# Patient Record
Sex: Female | Born: 1955 | Race: Black or African American | Hispanic: No | Marital: Single | Smoking: Current every day smoker
Health system: Southern US, Community
[De-identification: ages and names within clinical notes are randomized; demographics above are authoritative.]

## PROBLEM LIST (undated history)

## (undated) DIAGNOSIS — D649 Anemia, unspecified: Secondary | ICD-10-CM

## (undated) DIAGNOSIS — I471 Supraventricular tachycardia, unspecified: Secondary | ICD-10-CM

## (undated) DIAGNOSIS — I509 Heart failure, unspecified: Secondary | ICD-10-CM

## (undated) HISTORY — DX: Heart failure, unspecified: I50.9

## (undated) HISTORY — DX: Supraventricular tachycardia, unspecified: I47.10

## (undated) HISTORY — DX: Anemia, unspecified: D64.9

## (undated) HISTORY — DX: Supraventricular tachycardia: I47.1

---

## 2009-03-08 ENCOUNTER — Emergency Department: Payer: Self-pay | Admitting: Emergency Medicine

## 2011-01-28 ENCOUNTER — Emergency Department (HOSPITAL_COMMUNITY)
Admission: EM | Admit: 2011-01-28 | Discharge: 2011-01-28 | Disposition: A | Discharge status: Home / Self Care | Payer: No Typology Code available for payment source | Attending: Emergency Medicine | Admitting: Emergency Medicine

## 2011-01-28 ENCOUNTER — Emergency Department (HOSPITAL_COMMUNITY): Payer: No Typology Code available for payment source

## 2011-01-28 DIAGNOSIS — T148XXA Other injury of unspecified body region, initial encounter: Secondary | ICD-10-CM | POA: Insufficient documentation

## 2011-01-28 DIAGNOSIS — S335XXA Sprain of ligaments of lumbar spine, initial encounter: Secondary | ICD-10-CM | POA: Insufficient documentation

## 2014-06-17 ENCOUNTER — Emergency Department: Payer: Self-pay | Admitting: Emergency Medicine

## 2014-06-17 LAB — CBC WITH DIFFERENTIAL/PLATELET
Basophil #: 0.1 10*3/uL (ref 0.0–0.1)
Basophil %: 1.1 %
EOS PCT: 0.6 %
Eosinophil #: 0.1 10*3/uL (ref 0.0–0.7)
HCT: 42.7 % (ref 35.0–47.0)
HGB: 13.6 g/dL (ref 12.0–16.0)
LYMPHS PCT: 25.9 %
Lymphocyte #: 2.2 10*3/uL (ref 1.0–3.6)
MCH: 32.7 pg (ref 26.0–34.0)
MCHC: 31.8 g/dL — ABNORMAL LOW (ref 32.0–36.0)
MCV: 103 fL — ABNORMAL HIGH (ref 80–100)
MONO ABS: 0.6 x10 3/mm (ref 0.2–0.9)
MONOS PCT: 6.7 %
NEUTROS PCT: 65.7 %
Neutrophil #: 5.7 10*3/uL (ref 1.4–6.5)
PLATELETS: 356 10*3/uL (ref 150–440)
RBC: 4.16 10*6/uL (ref 3.80–5.20)
RDW: 13.8 % (ref 11.5–14.5)
WBC: 8.6 10*3/uL (ref 3.6–11.0)

## 2014-06-17 LAB — COMPREHENSIVE METABOLIC PANEL
ANION GAP: 12 (ref 7–16)
Albumin: 2.9 g/dL — ABNORMAL LOW (ref 3.4–5.0)
Alkaline Phosphatase: 124 U/L — ABNORMAL HIGH
BILIRUBIN TOTAL: 0.3 mg/dL (ref 0.2–1.0)
BUN: 5 mg/dL — AB (ref 7–18)
CREATININE: 0.54 mg/dL — AB (ref 0.60–1.30)
Calcium, Total: 8 mg/dL — ABNORMAL LOW (ref 8.5–10.1)
Chloride: 108 mmol/L — ABNORMAL HIGH (ref 98–107)
Co2: 21 mmol/L (ref 21–32)
EGFR (African American): >60
GLUCOSE: 94 mg/dL (ref 65–99)
OSMOLALITY: 278 (ref 275–301)
POTASSIUM: 3.6 mmol/L (ref 3.5–5.1)
SGOT(AST): 48 U/L — ABNORMAL HIGH (ref 15–37)
SGPT (ALT): 18 U/L
Sodium: 141 mmol/L (ref 136–145)
TOTAL PROTEIN: 7.2 g/dL (ref 6.4–8.2)

## 2014-06-17 LAB — URINALYSIS, COMPLETE
BACTERIA: NONE SEEN
BILIRUBIN, UR: NEGATIVE
Blood: NEGATIVE
GLUCOSE, UR: NEGATIVE mg/dL (ref 0–75)
KETONE: NEGATIVE
LEUKOCYTE ESTERASE: NEGATIVE
NITRITE: NEGATIVE
PROTEIN: NEGATIVE
Ph: 6 (ref 4.5–8.0)
RBC,UR: <1 /HPF (ref 0–5)
SPECIFIC GRAVITY: 1.006 (ref 1.003–1.030)
Squamous Epithelial: 1
WBC UR: NONE SEEN /HPF (ref 0–5)

## 2014-06-17 LAB — TROPONIN I

## 2018-04-19 ENCOUNTER — Encounter: Payer: Self-pay | Admitting: *Deleted

## 2018-04-19 ENCOUNTER — Emergency Department: Payer: Self-pay

## 2018-04-19 ENCOUNTER — Inpatient Hospital Stay
Admission: EM | Admit: 2018-04-19 | Discharge: 2018-04-21 | DRG: 640 | Disposition: A | Discharge status: Home / Self Care | Payer: Self-pay | Attending: Internal Medicine | Admitting: Internal Medicine

## 2018-04-19 ENCOUNTER — Other Ambulatory Visit: Payer: Self-pay

## 2018-04-19 DIAGNOSIS — E860 Dehydration: Secondary | ICD-10-CM | POA: Diagnosis present

## 2018-04-19 DIAGNOSIS — D638 Anemia in other chronic diseases classified elsewhere: Secondary | ICD-10-CM | POA: Diagnosis present

## 2018-04-19 DIAGNOSIS — E43 Unspecified severe protein-calorie malnutrition: Secondary | ICD-10-CM

## 2018-04-19 DIAGNOSIS — F1020 Alcohol dependence, uncomplicated: Secondary | ICD-10-CM | POA: Diagnosis present

## 2018-04-19 DIAGNOSIS — F17210 Nicotine dependence, cigarettes, uncomplicated: Secondary | ICD-10-CM | POA: Diagnosis present

## 2018-04-19 DIAGNOSIS — E876 Hypokalemia: Secondary | ICD-10-CM | POA: Diagnosis present

## 2018-04-19 DIAGNOSIS — I471 Supraventricular tachycardia: Secondary | ICD-10-CM | POA: Diagnosis present

## 2018-04-19 DIAGNOSIS — E872 Acidosis: Secondary | ICD-10-CM | POA: Diagnosis present

## 2018-04-19 DIAGNOSIS — E871 Hypo-osmolality and hyponatremia: Principal | ICD-10-CM | POA: Diagnosis present

## 2018-04-19 DIAGNOSIS — Z681 Body mass index (BMI) 19 or less, adult: Secondary | ICD-10-CM

## 2018-04-19 LAB — OSMOLALITY: Osmolality: 249 mOsm/kg — CL (ref 275–295)

## 2018-04-19 LAB — PROTIME-INR
INR: 0.87
Prothrombin Time: 11.7 seconds (ref 11.4–15.2)

## 2018-04-19 LAB — URINALYSIS, COMPLETE (UACMP) WITH MICROSCOPIC
BILIRUBIN URINE: NEGATIVE
Glucose, UA: NEGATIVE mg/dL
HGB URINE DIPSTICK: NEGATIVE
Ketones, ur: 5 mg/dL — AB
Leukocytes, UA: NEGATIVE
NITRITE: NEGATIVE
PROTEIN: NEGATIVE mg/dL
Specific Gravity, Urine: 1.012 (ref 1.005–1.030)
pH: 5 (ref 5.0–8.0)

## 2018-04-19 LAB — BASIC METABOLIC PANEL
ANION GAP: 15 (ref 5–15)
BUN: 7 mg/dL — ABNORMAL LOW (ref 8–23)
CHLORIDE: 82 mmol/L — AB (ref 98–111)
CO2: 20 mmol/L — AB (ref 22–32)
Calcium: 8.1 mg/dL — ABNORMAL LOW (ref 8.9–10.3)
Creatinine, Ser: 0.6 mg/dL (ref 0.44–1.00)
GFR calc Af Amer: >60 mL/min (ref >60)
GLUCOSE: 138 mg/dL — AB (ref 70–99)
POTASSIUM: 2.4 mmol/L — AB (ref 3.5–5.1)
Sodium: 117 mmol/L — CL (ref 135–145)

## 2018-04-19 LAB — HEPATIC FUNCTION PANEL
ALT: 17 U/L (ref 0–44)
AST: 41 U/L (ref 15–41)
Albumin: 2.5 g/dL — ABNORMAL LOW (ref 3.5–5.0)
Alkaline Phosphatase: 56 U/L (ref 38–126)
BILIRUBIN DIRECT: 0.3 mg/dL — AB (ref 0.0–0.2)
BILIRUBIN INDIRECT: 0.6 mg/dL (ref 0.3–0.9)
Total Bilirubin: 0.9 mg/dL (ref 0.3–1.2)
Total Protein: 5.4 g/dL — ABNORMAL LOW (ref 6.5–8.1)

## 2018-04-19 LAB — CBC
HEMATOCRIT: 34.4 % — AB (ref 35.0–47.0)
HEMOGLOBIN: 12 g/dL (ref 12.0–16.0)
MCH: 34.5 pg — ABNORMAL HIGH (ref 26.0–34.0)
MCHC: 34.9 g/dL (ref 32.0–36.0)
MCV: 98.8 fL (ref 80.0–100.0)
Platelets: 206 10*3/uL (ref 150–440)
RBC: 3.48 MIL/uL — ABNORMAL LOW (ref 3.80–5.20)
RDW: 13.9 % (ref 11.5–14.5)
WBC: 12.9 10*3/uL — ABNORMAL HIGH (ref 3.6–11.0)

## 2018-04-19 LAB — MAGNESIUM
MAGNESIUM: 1.7 mg/dL (ref 1.7–2.4)
Magnesium: 1.8 mg/dL (ref 1.7–2.4)

## 2018-04-19 LAB — SODIUM, URINE, RANDOM: Sodium, Ur: <10 mmol/L

## 2018-04-19 LAB — OSMOLALITY, URINE: OSMOLALITY UR: 342 mosm/kg (ref 300–900)

## 2018-04-19 LAB — TROPONIN I

## 2018-04-19 MED ORDER — LORAZEPAM 2 MG/ML IJ SOLN: 1.0000 mg | Freq: Four times a day (QID) | INTRAMUSCULAR | Status: DC | PRN

## 2018-04-19 MED ORDER — POTASSIUM CHLORIDE CRYS ER 20 MEQ PO TBCR
40.0000 meq | EXTENDED_RELEASE_TABLET | Freq: Once | ORAL | Status: AC
  Administered 2018-04-19: 40 meq via ORAL
  Filled 2018-04-19: qty 2

## 2018-04-19 MED ORDER — SODIUM CHLORIDE 0.9 % IV SOLN
Freq: Once | INTRAVENOUS | Status: AC
  Administered 2018-04-19: 16:00:00 via INTRAVENOUS

## 2018-04-19 MED ORDER — ONDANSETRON HCL 4 MG/2ML IJ SOLN: 4.0000 mg | Freq: Four times a day (QID) | INTRAMUSCULAR | Status: DC | PRN

## 2018-04-19 MED ORDER — ONDANSETRON HCL 4 MG PO TABS: 4.0000 mg | ORAL_TABLET | Freq: Four times a day (QID) | ORAL | Status: DC | PRN

## 2018-04-19 MED ORDER — LORAZEPAM 1 MG PO TABS: 1.0000 mg | ORAL_TABLET | Freq: Four times a day (QID) | ORAL | Status: DC | PRN

## 2018-04-19 MED ORDER — THIAMINE HCL 100 MG/ML IJ SOLN
Freq: Once | INTRAVENOUS | Status: AC
  Administered 2018-04-19: 17:00:00 via INTRAVENOUS
  Filled 2018-04-19: qty 1000

## 2018-04-19 MED ORDER — ACETAMINOPHEN 325 MG PO TABS: 650.0000 mg | ORAL_TABLET | Freq: Four times a day (QID) | ORAL | Status: DC | PRN

## 2018-04-19 MED ORDER — POTASSIUM CHLORIDE 10 MEQ/100ML IV SOLN
10.0000 meq | INTRAVENOUS | Status: AC
Start: 2018-04-19 — End: 2018-04-19
  Administered 2018-04-19: 10 meq via INTRAVENOUS
  Filled 2018-04-19 (×2): qty 100

## 2018-04-19 MED ORDER — FOLIC ACID 1 MG PO TABS
1.0000 mg | ORAL_TABLET | Freq: Every day | ORAL | Status: DC
  Administered 2018-04-20 – 2018-04-21 (×2): 1 mg via ORAL
  Filled 2018-04-19 (×2): qty 1

## 2018-04-19 MED ORDER — THIAMINE HCL 100 MG/ML IJ SOLN
100.0000 mg | Freq: Every day | INTRAMUSCULAR | Status: DC
  Filled 2018-04-19: qty 1

## 2018-04-19 MED ORDER — ADULT MULTIVITAMIN W/MINERALS CH
1.0000 | ORAL_TABLET | Freq: Every day | ORAL | Status: DC
  Administered 2018-04-20 – 2018-04-21 (×2): 1 via ORAL
  Filled 2018-04-19 (×2): qty 1

## 2018-04-19 MED ORDER — ENOXAPARIN SODIUM 40 MG/0.4ML ~~LOC~~ SOLN
40.0000 mg | SUBCUTANEOUS | Status: DC
  Filled 2018-04-19 (×2): qty 0.4

## 2018-04-19 MED ORDER — VITAMIN B-1 100 MG PO TABS
100.0000 mg | ORAL_TABLET | Freq: Every day | ORAL | Status: DC
  Administered 2018-04-20 – 2018-04-21 (×2): 100 mg via ORAL
  Filled 2018-04-19 (×2): qty 1

## 2018-04-19 MED ORDER — ACETAMINOPHEN 650 MG RE SUPP: 650.0000 mg | Freq: Four times a day (QID) | RECTAL | Status: DC | PRN

## 2018-04-19 MED ORDER — LORAZEPAM 2 MG/ML IJ SOLN
1.0000 mg | Freq: Once | INTRAMUSCULAR | Status: AC
Start: 2018-04-19 — End: 2018-04-19
  Administered 2018-04-19: 1 mg via INTRAVENOUS
  Filled 2018-04-19: qty 1

## 2018-04-19 MED ORDER — ADENOSINE 6 MG/2ML IV SOLN
6.0000 mg | Freq: Once | INTRAVENOUS | Status: DC
  Filled 2018-04-19: qty 2

## 2018-04-19 MED ORDER — POTASSIUM CHLORIDE IN NACL 20-0.9 MEQ/L-% IV SOLN
INTRAVENOUS | Status: DC
  Administered 2018-04-20 (×3): via INTRAVENOUS
  Filled 2018-04-19 (×5): qty 1000

## 2018-04-19 NOTE — ED Triage Notes (Addendum)
P to ED with family that is reporting increased weakness and confusion for the past three weeks that has been getting increasingly worse. Pt has been drooling more per family with a decreased appetite. Family reports pt has been unable to remember conversations and in triage pt stated the wrong date for multiple events in her hx.   Pt is also a daily drinker. "a couple beers a day"

## 2018-04-19 NOTE — ED Provider Notes (Signed)
[facility] Emergency Department Provider Note       Time seen: ----------------------------------------- 3:48 PM on 04/19/2018 ----------------------------------------- Level V caveat: History/ROS limited by altered mental status  I have reviewed the triage vital signs and the nursing notes.  HISTORY   Chief Complaint Weakness and Altered Mental Status   HPI Peggy Brown is a 62 y.o. female with a history of chronic alcohol abuse who presents to the ED for increased weakness and confusion for the past 3 weeks.  This is been increasingly getting worse with poor appetite and poor p.o. intake other than alcohol.  Family reports she has been and unable to remember conversations and gave the wrong date for multiple events in her history.  She only reports drinking a couple beers a day but the family states it is much more than this.  She arrives with some altered mental status  History reviewed. No pertinent past medical history.  There are no active problems to display for this patient.   Past Surgical History:  Procedure Laterality Date  . CESAREAN SECTION      Allergies Patient has no allergy information on record.  Social History Social History   Tobacco Use  . Smoking status: Current Every Day Smoker    Packs/day: 0.50    Types: Cigarettes  . Smokeless tobacco: Never Used  Substance Use Topics  . Alcohol use: Yes    Alcohol/week: 1.2 oz    Types: 2 Cans of beer per week  . Drug use: Never   Review of Systems Constitutional: Negative for fever. Cardiovascular: Negative for chest pain. Respiratory: Negative for shortness of breath. Gastrointestinal: Negative for abdominal pain, vomiting and diarrhea. Musculoskeletal: Negative for back pain. Skin: Negative for rash. Neurological: Positive for weakness, confusion  All systems negative/normal/unremarkable except as stated in the  HPI  ____________________________________________   PHYSICAL EXAM:  VITAL SIGNS: ED Triage Vitals  Enc Vitals Group     BP 04/19/18 1505 (!) 144/81     Pulse Rate 04/19/18 1505 (!) 121     Resp 04/19/18 1505 (!) 22     Temp 04/19/18 1505 98.2 F (36.8 C)     Temp Source 04/19/18 1505 Oral     SpO2 04/19/18 1505 100 %     Weight 04/19/18 1506 110 lb (49.9 kg)     Height 04/19/18 1506 5\' 1"  (1.549 m)     Head Circumference --      Peak Flow --      Pain Score 04/19/18 1506 0     Pain Loc --      Pain Edu? --      Excl. in GC? --    Constitutional: Alert and oriented.  Cachectic Eyes: Conjunctivae are normal. Normal extraocular movements. ENT   Head: Normocephalic and atraumatic.   Nose: No congestion/rhinnorhea.   Mouth/Throat: Mucous membranes are moist.   Neck: No stridor. Cardiovascular: Rapid rate, regular rhythm. No murmurs, rubs, or gallops. Respiratory: Normal respiratory effort without tachypnea nor retractions. Breath sounds are clear and equal bilaterally. No wheezes/rales/rhonchi. Gastrointestinal: Soft and nontender. Normal bowel sounds Musculoskeletal: Nontender with normal range of motion in extremities. No lower extremity tenderness nor edema. Neurologic:  Normal speech and language. No gross focal neurologic deficits are appreciated.  Skin:  Skin is warm, dry and intact. No rash noted. Psychiatric: Mood and affect are normal. Speech and behavior are normal.  ____________________________________________  EKG: Interpreted by me.  Sinus tachycardia with a rate of 111 bpm, normal  PR interval, normal QRS width, ST segment abnormalities concerning for ischemia  Repeat EKG interpreted by me reveals SVT with a rate of 195 bpm, repolarization abnormality likely rate related, normal QT, normal axis  ____________________________________________  ED COURSE:  As part of my medical decision making, I reviewed the following data within the electronic  MEDICAL RECORD NUMBER History obtained from family if available, nursing notes, old chart and ekg, as well as notes from prior ED visits. Patient presented for weakness, chronic alcohol abuse and confusion, we will assess with labs and imaging as indicated at this time. Clinical Course as of Apr 19 1642  Thu Apr 19, 2018  1607 Shunt spontaneously converted out of SVT   [JW]    Clinical Course User Index [JW] Emily FilbertWilliams, Tameem Pullara E, MD   Procedures ____________________________________________   LABS (pertinent positives/negatives)  Labs Reviewed  BASIC METABOLIC PANEL - Abnormal; Notable for the following components:      Result Value   Sodium 117 (*)    Potassium 2.4 (*)    Chloride 82 (*)    CO2 20 (*)    Glucose, Bld 138 (*)    BUN 7 (*)    Calcium 8.1 (*)    All other components within normal limits  CBC - Abnormal; Notable for the following components:   WBC 12.9 (*)    RBC 3.48 (*)    HCT 34.4 (*)    MCH 34.5 (*)    All other components within normal limits  URINALYSIS, COMPLETE (UACMP) WITH MICROSCOPIC - Abnormal; Notable for the following components:   Color, Urine YELLOW (*)    APPearance CLEAR (*)    Ketones, ur 5 (*)    Bacteria, UA RARE (*)    All other components within normal limits  TROPONIN I  HEPATIC FUNCTION PANEL  PROTIME-INR  MAGNESIUM   CRITICAL CARE Performed by: Ulice DashJohnathan E Arian Murley   Total critical care time: 30 minutes  Critical care time was exclusive of separately billable procedures and treating other patients.  Critical care was necessary to treat or prevent imminent or life-threatening deterioration.  Critical care was time spent personally by me on the following activities: development of treatment plan with patient and/or surrogate as well as nursing, discussions with consultants, evaluation of patient's response to treatment, examination of patient, obtaining history from patient or surrogate, ordering and performing treatments and  interventions, ordering and review of laboratory studies, ordering and review of radiographic studies, pulse oximetry and re-evaluation of patient's condition.  RADIOLOGY Images were viewed by me  Chest x-ray  IMPRESSION: No edema or consolidation. ____________________________________________  DIFFERENTIAL DIAGNOSIS   Dehydration, electrolyte abnormality, chronic alcohol abuse, vitamin deficiency, SVT  FINAL ASSESSMENT AND PLAN  Alcohol use disorder, hyponatremia, hypokalemia   Plan: The patient had presented for weakness and confusion. Patient's labs did indicate significant abnormalities including a sodium of 117, potassium of 2.4 and chloride of 82.. Patient's imaging did not reveal any acute process.  She was started on IV fluids and we began repleting her electrolytes with oral and IV medications.  She was also given Ativan to prevent alcohol withdrawal.  I have ordered a banana bag as well.  She was intermittently in and out of SVT but appears to have stabilized at this time without using adenosine.  I will discuss with the hospitalist for admission.   Ulice DashJohnathan E Joshiah Traynham, MD   Note: This note was generated in part or whole with voice recognition software. Voice recognition is usually quite  accurate but there are transcription errors that can and very often do occur. I apologize for any typographical errors that were not detected and corrected.     Emily Filbert, MD 04/19/18 [phone]

## 2018-04-19 NOTE — H&P (Addendum)
[facility] [place] [facility]   PATIENT NAME: Peggy Brown Susi    MR#:  [id number]  DATE OF BIRTH:  [birth year]  DATE OF ADMISSION:  04/19/2018  PRIMARY CARE PHYSICIAN: No primary care provider on file.   REQUESTING/REFERRING PHYSICIAN: Dr. Daryel NovemberJonathan Williams  CHIEF COMPLAINT:   Chief Complaint  Patient presents with  . Weakness  . Altered Mental Status    HISTORY OF PRESENT ILLNESS:  Peggy Brown [place]  is a 62 y.o. female with a known history of alcohol abuse, tobacco abuse who presents to the hospital due to altered mental status and weakness.  Patient herself is a very poor historian therefore most of the history obtained from the family at bedside and also the chart and ER physician.  As per the family patient has been more more lethargic and weak and has been repeating herself over the past few days.  She has not eaten much in the past week or so.  Today she was more more lethargic and seemed more confused and therefore the family was concerned and brought her to the ER for further evaluation.  Patient does drink about 2 bottles of  40 ounces beers daily.  Patient presented to the ER and was noted to be severely hyponatremic with a sodium of 117 and hypokalemic with potassium of 2.4.  Hospitalist services were contacted for admission.  PAST MEDICAL HISTORY:  History reviewed. No pertinent past medical history.  PAST SURGICAL HISTORY:   Past Surgical History:  Procedure Laterality Date  . CESAREAN SECTION      SOCIAL HISTORY:   Social History   Tobacco Use  . Smoking status: Current Every Day Smoker    Packs/day: 0.50    Years: 45.00    Pack years: 22.50    Types: Cigarettes  . Smokeless tobacco: Never Used  Substance Use Topics  . Alcohol use: Yes    Alcohol/week: 1.2 oz    Types: 2 Cans of beer per week    FAMILY HISTORY:   Family History  Problem Relation Age of Onset  . Hypertension Mother   . Thyroid cancer Mother   . COPD Father      DRUG ALLERGIES:  Not on File  REVIEW OF SYSTEMS:   Review of Systems  Unable to perform ROS: Mental acuity    MEDICATIONS AT HOME:   Prior to Admission medications   Not on File      VITAL SIGNS:  Blood pressure 128/80, pulse 91, temperature 98.2 F (36.8 C), temperature source Oral, resp. rate 19, height 5\' 1"  (1.549 m), weight 49.9 kg (110 lb), SpO2 100 %.  PHYSICAL EXAMINATION:  Physical Exam  GENERAL:  62 y.o.-year-old patient lying in the bed lethargic/encephalopathic. EYES: Pupils equal, round, reactive to light and accommodation. No scleral icterus. Extraocular muscles intact.  HEENT: Head atraumatic, normocephalic. Oropharynx and nasopharynx clear. No oropharyngeal erythema, dry oral mucosa. NECK:  Supple, no jugular venous distention. No thyroid enlargement, no tenderness.  LUNGS: Normal breath sounds bilaterally, no wheezing, rales, + upper airway rhonchi. No use of accessory muscles of respiration.  CARDIOVASCULAR: S1, S2 RRR. No murmurs, rubs, gallops, clicks.  ABDOMEN: Soft, nontender, nondistended. Bowel sounds present. No organomegaly or mass.  EXTREMITIES: No pedal edema, cyanosis, or clubbing. + 2 pedal & radial pulses b/l.   NEUROLOGIC: Cranial nerves II through XII are intact. No focal Motor or sensory deficits appreciated b/l PSYCHIATRIC: The patient is alert and oriented x 3. SKIN: No obvious rash, lesion, or  ulcer.   LABORATORY PANEL:   CBC Recent Labs  Lab 04/19/18 1513  WBC 12.9*  HGB 12.0  HCT 34.4*  PLT 206   ------------------------------------------------------------------------------------------------------------------  Chemistries  Recent Labs  Lab 04/19/18 1513  NA 117*  K 2.4*  CL 82*  CO2 20*  GLUCOSE 138*  BUN 7*  CREATININE 0.60  CALCIUM 8.1*   ------------------------------------------------------------------------------------------------------------------  Cardiac Enzymes No results for input(s): TROPONINI in  the last 168 hours. ------------------------------------------------------------------------------------------------------------------  RADIOLOGY:  Dg Chest 1 View  Result Date: 04/19/2018 CLINICAL DATA:  Confusion and weakness EXAM: CHEST  1 VIEW COMPARISON:  June 17, 2014 FINDINGS: There is no evident edema or consolidation. Heart size and pulmonary vascularity are normal. No adenopathy. No bone lesions. IMPRESSION: No edema or consolidation. Electronically Signed   By: Bretta Bang III M.D.   On: 04/19/2018 16:35     IMPRESSION AND PLAN:   62 yo female w/ hx of ETOH abuse, tobacco abuse, presents to the hospital due to AMS, weakness and noted to be severely Hyponatremic and hypokalemic.    1.  Acute hyponatremia-secondary to dehydration and be reported mania. - We will hydrate the patient with IV fluids, follow sodium serially. - We will get a nephrology consult, discussed with the nephrologist will see the patient tomorrow.  I will check a urine and serum osmolality and a random urine sodium.  2.  Hypokalemia-also secondary to alcohol abuse/dehydration. -We will give the patient IV and oral potassium supplements.  Repeat level in the morning. -Check magnesium level and replace accordingly.  3.  Alcohol abuse- patient is a high risk for going to alcohol withdrawal. - We will place the patient on CIWA protocol.  4.  SVT-patient developed some SVT in the ER.  She responded well to adenosine and now is in a normal sinus rhythm.  This was likely secondary to the patient's electrolyte abnormalities.  We will keep the patient on telemetry for now.  All the records are reviewed and case discussed with ED provider. Management plans discussed with the patient, family and they are in agreement.  CODE STATUS: Full code  TOTAL TIME TAKING CARE OF THIS PATIENT: 45 minutes.    Houston Siren M.D on 04/19/2018 at 5:31 PM  Between 7am to 6pm - Pager - [phone]  After 6pm go to  [email or web address] - password [email or web address] [facility]  [place] [facility]  Office  [phone]  CC: Primary care physician; No primary care provider on file.

## 2018-04-20 LAB — BASIC METABOLIC PANEL
Anion gap: 3 — ABNORMAL LOW (ref 5–15)
BUN: 6 mg/dL — AB (ref 8–23)
CHLORIDE: 103 mmol/L (ref 98–111)
CO2: 24 mmol/L (ref 22–32)
Calcium: 7.5 mg/dL — ABNORMAL LOW (ref 8.9–10.3)
Creatinine, Ser: 0.4 mg/dL — ABNORMAL LOW (ref 0.44–1.00)
GFR calc Af Amer: >60 mL/min (ref >60)
GFR calc non Af Amer: >60 mL/min (ref >60)
GLUCOSE: 92 mg/dL (ref 70–99)
POTASSIUM: 4 mmol/L (ref 3.5–5.1)
Sodium: 130 mmol/L — ABNORMAL LOW (ref 135–145)

## 2018-04-20 LAB — IRON AND TIBC
Iron: 66 ug/dL (ref 28–170)
SATURATION RATIOS: 39 % — AB (ref 10.4–31.8)
TIBC: 170 ug/dL — ABNORMAL LOW (ref 250–450)
UIBC: 104 ug/dL

## 2018-04-20 LAB — CBC
HEMATOCRIT: 27.9 % — AB (ref 35.0–47.0)
Hemoglobin: 9.7 g/dL — ABNORMAL LOW (ref 12.0–16.0)
MCH: 34.6 pg — AB (ref 26.0–34.0)
MCHC: 34.8 g/dL (ref 32.0–36.0)
MCV: 99.3 fL (ref 80.0–100.0)
Platelets: 154 10*3/uL (ref 150–440)
RBC: 2.81 MIL/uL — ABNORMAL LOW (ref 3.80–5.20)
RDW: 13.4 % (ref 11.5–14.5)
WBC: 9.5 10*3/uL (ref 3.6–11.0)

## 2018-04-20 LAB — RETICULOCYTES
RBC.: 2.84 MIL/uL — AB (ref 3.80–5.20)
RETIC CT PCT: 1.3 % (ref 0.4–3.1)
Retic Count, Absolute: 36.9 10*3/uL (ref 19.0–183.0)

## 2018-04-20 LAB — FOLATE: Folate: >100 ng/mL (ref >5.9)

## 2018-04-20 LAB — PHOSPHORUS: Phosphorus: 1.8 mg/dL — ABNORMAL LOW (ref 2.5–4.6)

## 2018-04-20 LAB — FERRITIN: Ferritin: 286 ng/mL (ref 11–307)

## 2018-04-20 LAB — VITAMIN B12: VITAMIN B 12: 1129 pg/mL — AB (ref 180–914)

## 2018-04-20 MED ORDER — ENSURE ENLIVE PO LIQD
237.0000 mL | Freq: Two times a day (BID) | ORAL | Status: DC
  Administered 2018-04-20 – 2018-04-21 (×3): 237 mL via ORAL

## 2018-04-20 MED ORDER — MAGNESIUM OXIDE 400 (241.3 MG) MG PO TABS
400.0000 mg | ORAL_TABLET | Freq: Two times a day (BID) | ORAL | Status: DC
  Administered 2018-04-20 – 2018-04-21 (×3): 400 mg via ORAL
  Filled 2018-04-20 (×3): qty 1

## 2018-04-20 NOTE — Progress Notes (Signed)
Admitted from ED with severe electrolyte imbalance and ETOH withdrawal.  Last drink was today according to daughter.  Patient is easily arousable but drowsy - received Ativan in ED.  BP and HR WNL.   Rhythm normal sinus.

## 2018-04-20 NOTE — Plan of Care (Signed)
°  Problem: Coping: °Goal: Level of anxiety will decrease °Outcome: Progressing °  °

## 2018-04-20 NOTE — Progress Notes (Signed)
[facility] [place] at [facility]   PATIENT NAME: Peggy SparrowCeleste Brown    MR#:  [id number]  DATE OF BIRTH:  03/21/1956  SUBJECTIVE:  Doing well today. Patient states that she feels fine. No concerns. Denies any chest pain or palpitations.  REVIEW OF SYSTEMS:  Review of Systems  Constitutional: Negative for chills and fever.  Respiratory: Positive for cough. Negative for sputum production and shortness of breath.   Cardiovascular: Negative for chest pain, palpitations and leg swelling.  Gastrointestinal: Negative for diarrhea, nausea and vomiting.  Musculoskeletal: Negative for joint pain and myalgias.  Neurological: Negative for dizziness and headaches.    DRUG ALLERGIES:  Not on File VITALS:  Blood pressure 102/69, pulse 96, temperature 97.9 F (36.6 C), temperature source Oral, resp. rate 16, height 5\' 1"  (1.549 m), weight 41.2 kg (90 lb 14.4 oz), SpO2 100 %. PHYSICAL EXAMINATION:  Physical Exam  GENERAL:  62 y.o.-year-old patient, sitting up in bed, pleasant and conversational. EYES: PERRLA. No scleral icterus. Extraocular muscles intact.  HEENT: Head atraumatic, normocephalic. Moist mucous membranes. NECK:  Supple, no JVD. No thyroid enlargement, no tenderness.  LUNGS: Normal breath sounds bilaterally, no wheezing, rales, or rhonchi. No use of accessory muscles of respiration.  CARDIOVASCULAR: S1, S2, RRR. No murmurs, rubs, gallops, clicks.  ABDOMEN: Soft, nontender, nondistended. Bowel sounds present. No organomegaly or mass.  EXTREMITIES: No pedal edema, cyanosis, or clubbing.   NEUROLOGIC: Cranial nerves II through XII are intact. No focal motor or sensory deficits appreciated b/l PSYCHIATRIC: The patient is alert and oriented x 3. Appropriate affect. SKIN: No obvious rash, lesion, or ulcers on exposed skin.  LABORATORY PANEL:  Female CBC Recent Labs  Lab 04/20/18 0436  WBC 9.5  HGB 9.7*  HCT 27.9*  PLT 154    ------------------------------------------------------------------------------------------------------------------ Chemistries  Recent Labs  Lab 04/19/18 1632 04/19/18 1950 04/20/18 0436  NA  --   --  130*  K  --   --  4.0  CL  --   --  103  CO2  --   --  24  GLUCOSE  --   --  92  BUN  --   --  6*  CREATININE  --   --  0.40*  CALCIUM  --   --  7.5*  MG 1.7 1.8  --   AST 41  --   --   ALT 17  --   --   ALKPHOS 56  --   --   BILITOT 0.9  --   --    RADIOLOGY:  Dg Chest 1 View  Result Date: 04/19/2018 CLINICAL DATA:  Confusion and weakness EXAM: CHEST  1 VIEW COMPARISON:  June 17, 2014 FINDINGS: There is no evident edema or consolidation. Heart size and pulmonary vascularity are normal. No adenopathy. No bone lesions. IMPRESSION: No edema or consolidation. Electronically Signed   By: Bretta BangWilliam  Woodruff III M.D.   On: 04/19/2018 16:35   ASSESSMENT AND PLAN:   62 yo female w/ hx of ETOH abuse, tobacco abuse, presents to the hospital due to AMS, weakness and noted to be severely Hyponatremic and hypokalemic.    1.  Acute hyponatremia- secondary to dehydration and beer potomania. Improving. Na 117 on admission > 130 today.  - nephrology following - IVFs cut in half for today, encourage po intake - can d/c IVFs tomorrow  2.  Hypokalemia-also secondary to alcohol abuse/dehydration. K improved from 2.4 on admission to 4.0 today. - receiving K in IVFs -  check bmp in the am  3.  Alcohol abuse- patient is a high risk for going to alcohol withdrawal. - continue CIWA- scores have been 0  4.  SVT-patient developed some SVT in the ER.  She responded well to adenosine and now is in a normal sinus rhythm.  This was likely secondary to the patient's electrolyte abnormalities - continue telemetry  5. Anemia- borderline macrocytic, likely due to alcohol use. No active bleeding. - check vitamin B12, folate, iron, TIBC, ferritin, reticulocytes - needs outpatient colonoscopy  Care  management consult to help patient establish care with a PCP.  All the records are reviewed and case discussed with Care Management/Social Worker. Management plans discussed with the patient, family and they are in agreement.  CODE STATUS: Full Code  TOTAL TIME TAKING CARE OF THIS PATIENT: 30 minutes.   More than 50% of the time was spent in counseling/coordination of care: YES  POSSIBLE D/C LIKELY TOMORROW, DEPENDING ON CLINICAL CONDITION.   Jinny Blossom Mayo M.D on 04/20/2018 at 1:16 PM  Between 7am to 6pm - Pager - [phone]  After 6pm go to [email or web address] - Social research officer, government  [facility] [place] [facility]  Office  [phone]  CC: Primary care physician; Patient, No Pcp Per  Note: This dictation was prepared with Dragon dictation along with smaller phrase technology. Any transcriptional errors that result from this process are unintentional.

## 2018-04-20 NOTE — Progress Notes (Signed)
She has bent her arm so fully and tangled the tubing with the phone cord and tele wires that the IV site in her Right AC is bleeding.  I've moved the IV to her Left AC and splinted it with gauze.  I have stressed to her that she needs to be cautious with her arm movements, with no success.  I just found her opening the pump door to stop the pump from beeping.  Fortunately the tubing is clamped off when the door is opened.  I've locked the pump settings.

## 2018-04-20 NOTE — Progress Notes (Signed)
Initial Nutrition Assessment  DOCUMENTATION CODES:   Severe malnutrition in context of social or environmental circumstances  INTERVENTION:   Ensure Enlive po BID, each supplement provides 350 kcal and 20 grams of protein  Magic cup TID with meals, each supplement provides 290 kcal and 9 grams of protein  MVI, thiamine and folic acid daily in the setting of etoh abuse  Pt likely at high refeeding risk; recommend monitor K, Mg and P labs daily until labs stabilize   NUTRITION DIAGNOSIS:   Severe Malnutrition related to social / environmental circumstances(etoh abuse ) as evidenced by severe fat depletion, severe muscle depletion.  GOAL:   Patient will meet greater than or equal to 90% of their needs  MONITOR:   PO intake, Supplement acceptance, Labs, Weight trends, Skin, I & O's  REASON FOR ASSESSMENT:   Other (Comment)(low BMI )    ASSESSMENT:   62 yo female w/ hx of ETOH abuse, tobacco abuse, presents to the hospital due to AMS, weakness and noted to be severely Hyponatremic and hypokalemic.     Met with pt in room today. Pt reports "I eat when I want to" when asked how her oral intake has been at home. RD suspects pt with poor appetite and oral intake at baseline r/t etoh abuse. Pt reports her weight is stable; there is no weight history in chart to confirm. Pt eating lunch at time of RD visit today; had eaten abut 50% of her meal tray. Pt does not drink supplements at home but reports that she does like Ensure. Pt likely at high refeeding risk; recommend monitor K, Mg and P labs. RD will order supplements. Continue MVI, folic acid and thiamine.    Medications reviewed and include: lovenox, folic acid, thiamine, MVI, Mg oxide, NaCl w/ KCl '@75ml' /hr  Labs reviewed: Na 130(L), BUN 6(L), creat 0.40(L), 7.5(L) Mg 1.8 wnl- 7/18 Hgb 9.7(L), Hct 27.9(L)  NUTRITION - FOCUSED PHYSICAL EXAM:    Most Recent Value  Orbital Region  Moderate depletion  Upper Arm Region  Severe  depletion  Thoracic and Lumbar Region  Severe depletion  Buccal Region  Moderate depletion  Temple Region  Mild depletion  Clavicle Bone Region  Moderate depletion  Clavicle and Acromion Bone Region  Moderate depletion  Scapular Bone Region  Moderate depletion  Dorsal Hand  Severe depletion  Patellar Region  Severe depletion  Anterior Thigh Region  Severe depletion  Posterior Calf Region  Severe depletion  Edema (RD Assessment)  None  Hair  Reviewed  Eyes  Reviewed  Mouth  Reviewed  Skin  Reviewed  Nails  Reviewed     Diet Order:   Diet Order           Diet regular Room service appropriate? Yes; Fluid consistency: Thin  Diet effective now         EDUCATION NEEDS:   Education needs have been addressed  Skin:  Skin Assessment: Reviewed RN Assessment  Last BM:  7/18  Height:   Ht Readings from Last 1 Encounters:  04/20/18 '5\' 1"'  (1.549 m)    Weight:   Wt Readings from Last 1 Encounters:  04/20/18 90 lb 14.4 oz (41.2 kg)    Ideal Body Weight:  47.7 kg  BMI:  Body mass index is 17.18 kg/m.  Estimated Nutritional Needs:   Kcal:  1200-1400kcal/day   Protein:  61-70g/day   Fluid:  >1.0L/day   Koleen Distance MS, RD, LDN Pager #- [phone] Office#- [phone] After Hours Pager: [phone]

## 2018-04-20 NOTE — Consult Note (Signed)
[facility]  CONSULT NOTE    Date: 04/20/2018                  Patient Name:  Peggy Brown  MRN: [id number]  DOB: Jul 09, 1956  Age / Sex: 62 y.o., female         PCP: Patient, No Pcp Per                 Service Requesting Consult: Dr. Nancy Marus                 Reason for Consult: Hyponatremia            History of Present Illness: [name] is a 62 y.o. black female with EtOH abuse, and tobacco abuse, who was admitted to [facility] on 04/19/2018 for Hypokalemia [E87.6] Hyponatremia [E87.1] SVT (supraventricular tachycardia) (HCC) [I47.1] Alcohol use disorder, severe, dependence (HCC) [F10.20]   Patient's daughter is at bedside who assists with history taking. Patient has been drinking heavily with beer for last few months. She is living with a roommate.   Patient does not follow with a primary care physician.    Medications: Outpatient medications: No medications prior to admission.    Current medications: Current Facility-Administered Medications  Medication Dose Route Frequency Provider Last Rate Last Dose  . 0.9 % NaCl with KCl 20 mEq/ L  infusion   Intravenous Continuous Lamont Dowdy, MD 75 mL/hr at 04/20/18 0922    . acetaminophen (TYLENOL) tablet 650 mg  650 mg Oral Q6H PRN Houston Siren, MD       Or  . acetaminophen (TYLENOL) suppository 650 mg  650 mg Rectal Q6H PRN Houston Siren, MD      . enoxaparin (LOVENOX) injection 40 mg  40 mg Subcutaneous Q24H Sainani, Vivek J, MD      . folic acid (FOLVITE) tablet 1 mg  1 mg Oral Daily Houston Siren, MD   1 mg at 04/20/18 1008  . LORazepam (ATIVAN) tablet 1 mg  1 mg Oral Q6H PRN Houston Siren, MD       Or  . LORazepam (ATIVAN) injection 1 mg  1 mg Intravenous Q6H PRN Houston Siren, MD      . multivitamin with minerals tablet 1 tablet  1 tablet Oral Daily Houston Siren, MD   1 tablet at 04/20/18 1007  . ondansetron (ZOFRAN) tablet 4 mg  4 mg Oral Q6H PRN Houston Siren, MD        Or  . ondansetron (ZOFRAN) injection 4 mg  4 mg Intravenous Q6H PRN Houston Siren, MD      . thiamine (VITAMIN B-1) tablet 100 mg  100 mg Oral Daily Houston Siren, MD   100 mg at 04/20/18 1007   Or  . thiamine (B-1) injection 100 mg  100 mg Intravenous Daily Houston Siren, MD          Allergies: Not on File    Past Medical History: History reviewed. No pertinent past medical history.   Past Surgical History: Past Surgical History:  Procedure Laterality Date  . CESAREAN SECTION       Family History: Family History  Problem Relation Age of Onset  . Hypertension Mother   . Thyroid cancer Mother   . COPD Father      Social History: Social History   Socioeconomic History  . Marital status: Married    Spouse name: Not on file  . Number of  children: Not on file  . Years of education: Not on file  . Highest education level: Not on file  Occupational History  . Not on file  Social Needs  . Financial resource strain: Not on file  . Food insecurity:    Worry: Not on file    Inability: Not on file  . Transportation needs:    Medical: Not on file    Non-medical: Not on file  Tobacco Use  . Smoking status: Current Every Day Smoker    Packs/day: 0.50    Years: 45.00    Pack years: 22.50    Types: Cigarettes  . Smokeless tobacco: Never Used  Substance and Sexual Activity  . Alcohol use: Yes    Alcohol/week: 1.2 oz    Types: 2 Cans of beer per week  . Drug use: Never  . Sexual activity: Never  Lifestyle  . Physical activity:    Days per week: Not on file    Minutes per session: Not on file  . Stress: Not on file  Relationships  . Social connections:    Talks on phone: Not on file    Gets together: Not on file    Attends religious service: Not on file    Active member of club or organization: Not on file    Attends meetings of clubs or organizations: Not on file    Relationship status: Not on file  . Intimate partner violence:    Fear of  current or ex partner: Not on file    Emotionally abused: Not on file    Physically abused: Not on file    Forced sexual activity: Not on file  Other Topics Concern  . Not on file  Social History Narrative  . Not on file     Review of Systems: Review of Systems  Constitutional: Positive for malaise/fatigue and weight loss. Negative for chills, diaphoresis and fever.  HENT: Negative.  Negative for congestion, ear discharge, ear pain, hearing loss, nosebleeds, sinus pain, sore throat and tinnitus.   Eyes: Negative.  Negative for blurred vision, double vision, photophobia, pain, discharge and redness.  Respiratory: Negative for cough, hemoptysis, sputum production, shortness of breath, wheezing and stridor.   Cardiovascular: Negative.  Negative for chest pain, palpitations, orthopnea, claudication, leg swelling and PND.  Gastrointestinal: Negative.  Negative for abdominal pain, blood in stool, constipation, diarrhea, heartburn, melena, nausea and vomiting.  Genitourinary: Negative.  Negative for dysuria, flank pain, frequency, hematuria and urgency.  Musculoskeletal: Negative.  Negative for back pain, falls, joint pain, myalgias and neck pain.  Skin: Negative.  Negative for itching and rash.  Neurological: Negative.  Negative for dizziness, tingling, tremors, sensory change, speech change, focal weakness, seizures, loss of consciousness, weakness and headaches.  Endo/Heme/Allergies: Negative.  Negative for environmental allergies and polydipsia. Does not bruise/bleed easily.  Psychiatric/Behavioral: Positive for depression, memory loss and substance abuse. Negative for hallucinations and suicidal ideas. The patient is nervous/anxious. The patient does not have insomnia.     Vital Signs: Blood pressure 102/69, pulse 96, temperature 97.9 F (36.6 C), temperature source Oral, resp. rate 16, height 5\' 1"  (1.549 m), weight 41.2 kg (90 lb 14.4 oz), SpO2 100 %.  Weight trends: Filed Weights    04/19/18 1506 04/20/18 1013  Weight: 49.9 kg (110 lb) 41.2 kg (90 lb 14.4 oz)    Physical Exam: General: NAD, sitting up in bed  Head: Normocephalic, atraumatic. Moist oral mucosal membranes  Eyes: Anicteric, PERRL  Neck: Supple, trachea midline  Lungs:  Clear to auscultation  Heart: Regular rate and rhythm  Abdomen:  Soft, nontender,   Extremities:  no peripheral edema.  Neurologic: Nonfocal, moving all four extremities  Skin: No lesions         Lab results: Basic Metabolic Panel: Recent Labs  Lab 04/19/18 1513 04/19/18 1632 04/19/18 1950 04/20/18 0436  NA 117*  --   --  130*  K 2.4*  --   --  4.0  CL 82*  --   --  103  CO2 20*  --   --  24  GLUCOSE 138*  --   --  92  BUN 7*  --   --  6*  CREATININE 0.60  --   --  0.40*  CALCIUM 8.1*  --   --  7.5*  MG  --  1.7 1.8  --     Liver Function Tests: Recent Labs  Lab 04/19/18 1632  AST 41  ALT 17  ALKPHOS 56  BILITOT 0.9  PROT 5.4*  ALBUMIN 2.5*   No results for input(s): LIPASE, AMYLASE in the last 168 hours. No results for input(s): AMMONIA in the last 168 hours.  CBC: Recent Labs  Lab 04/19/18 1513 04/20/18 0436  WBC 12.9* 9.5  HGB 12.0 9.7*  HCT 34.4* 27.9*  MCV 98.8 99.3  PLT 206 154    Cardiac Enzymes: Recent Labs  Lab 04/19/18 1632  TROPONINI <0.03    BNP: Invalid input(s): POCBNP  CBG: No results for input(s): GLUCAP in the last 168 hours.  Microbiology: No results found for this or any previous visit.  Coagulation Studies: Recent Labs    04/19/18 1632  LABPROT 11.7  INR 0.87    Urinalysis: Recent Labs    04/19/18 1513  COLORURINE YELLOW*  LABSPEC 1.012  PHURINE 5.0  GLUCOSEU NEGATIVE  HGBUR NEGATIVE  BILIRUBINUR NEGATIVE  KETONESUR 5*  PROTEINUR NEGATIVE  NITRITE NEGATIVE  LEUKOCYTESUR NEGATIVE      Imaging: Dg Chest 1 View  Result Date: 04/19/2018 CLINICAL DATA:  Confusion and weakness EXAM: CHEST  1 VIEW COMPARISON:  June 17, 2014 FINDINGS: There is  no evident edema or consolidation. Heart size and pulmonary vascularity are normal. No adenopathy. No bone lesions. IMPRESSION: No edema or consolidation. Electronically Signed   By: Bretta Bang III M.D.   On: 04/19/2018 16:35      Assessment & Plan: [name] is a 62 y.o. black female with EtOH abuse, and tobacco abuse, who was admitted to [facility] on 04/19/2018 for Hypokalemia [E87.6] Hyponatremia [E87.1] SVT (supraventricular tachycardia) (HCC) [I47.1] Alcohol use disorder, severe, dependence (HCC) [F10.20]   1. Hyponatremia 2. Hypokalemia 3. Metabolic acidosis 4. Anemia - normocytic  Plan Hypovolemic hyponatremia secondary beer intoxication - Continue IV NS with potassium  - replace magnesium PO - Encourage PO intake.   LOS: 1 Peggy Brown 7/19/201911:28 AM

## 2018-04-20 NOTE — Progress Notes (Signed)
CRITICAL VALUE ALERT  Critical Value:  Osmolality of 249 mOsm/kg called by lab 04/19/18 at 2347.    Date & Time Notied:  04/20/18 0014  Provider Notified: On-call  Orders Received/Actions taken: awaiting call back

## 2018-04-20 NOTE — Progress Notes (Signed)
CRITICAL VALUE ALERT  Critical Value:  Osmolality of 249 mOsm/kg called by lab 04/19/18 at 2347  Date & Time Notied:  04/20/18 0014  Provider Notified: Dr. Cammy CopaAngela Maier  Orders Received/Actions taken: None; this does not change plan of care and is in line with the patient's diagnosis.

## 2018-04-21 DIAGNOSIS — E43 Unspecified severe protein-calorie malnutrition: Secondary | ICD-10-CM

## 2018-04-21 LAB — CBC
HCT: 23.7 % — ABNORMAL LOW (ref 35.0–47.0)
Hemoglobin: 8.5 g/dL — ABNORMAL LOW (ref 12.0–16.0)
MCH: 35.4 pg — AB (ref 26.0–34.0)
MCHC: 35.7 g/dL (ref 32.0–36.0)
MCV: 99.2 fL (ref 80.0–100.0)
PLATELETS: 169 10*3/uL (ref 150–440)
RBC: 2.39 MIL/uL — ABNORMAL LOW (ref 3.80–5.20)
RDW: 13.6 % (ref 11.5–14.5)
WBC: 9.1 10*3/uL (ref 3.6–11.0)

## 2018-04-21 LAB — BASIC METABOLIC PANEL
ANION GAP: 4 — AB (ref 5–15)
CALCIUM: 7.6 mg/dL — AB (ref 8.9–10.3)
CO2: 22 mmol/L (ref 22–32)
CREATININE: 0.41 mg/dL — AB (ref 0.44–1.00)
Chloride: 105 mmol/L (ref 98–111)
GFR calc non Af Amer: >60 mL/min (ref >60)
GLUCOSE: 86 mg/dL (ref 70–99)
POTASSIUM: 4.1 mmol/L (ref 3.5–5.1)
Sodium: 131 mmol/L — ABNORMAL LOW (ref 135–145)

## 2018-04-21 LAB — HIV ANTIBODY (ROUTINE TESTING W REFLEX): HIV SCREEN 4TH GENERATION: NONREACTIVE

## 2018-04-21 LAB — PHOSPHORUS: PHOSPHORUS: 1.7 mg/dL — AB (ref 2.5–4.6)

## 2018-04-21 MED ORDER — ENSURE ENLIVE PO LIQD: 237.0000 mL | Freq: Two times a day (BID) | ORAL | 0 refills | Status: DC

## 2018-04-21 MED ORDER — THIAMINE HCL 100 MG PO TABS: 100.0000 mg | ORAL_TABLET | Freq: Every day | ORAL | 0 refills | Status: DC

## 2018-04-21 MED ORDER — FOLIC ACID 1 MG PO TABS: 1.0000 mg | ORAL_TABLET | Freq: Every day | ORAL | 0 refills | Status: DC

## 2018-04-21 MED ORDER — METOPROLOL SUCCINATE ER 25 MG PO TB24
12.5000 mg | ORAL_TABLET | Freq: Every day | ORAL | Status: DC
  Administered 2018-04-21: 12.5 mg via ORAL
  Filled 2018-04-21: qty 1

## 2018-04-21 MED ORDER — METOPROLOL SUCCINATE ER 25 MG PO TB24: 12.5000 mg | ORAL_TABLET | Freq: Every day | ORAL | 0 refills | Status: DC

## 2018-04-21 MED ORDER — MAGNESIUM OXIDE 400 (241.3 MG) MG PO TABS: 400.0000 mg | ORAL_TABLET | Freq: Every day | ORAL | 0 refills | Status: DC

## 2018-04-21 MED ORDER — ADULT MULTIVITAMIN W/MINERALS CH: 1.0000 | ORAL_TABLET | Freq: Every day | ORAL | 0 refills | Status: DC

## 2018-04-21 MED ORDER — SODIUM PHOSPHATES 45 MMOLE/15ML IV SOLN
15.0000 mmol | Freq: Once | INTRAVENOUS | Status: AC
  Administered 2018-04-21: 15 mmol via INTRAVENOUS
  Filled 2018-04-21: qty 5

## 2018-04-21 NOTE — Progress Notes (Signed)
Patient is to be discharge, Alcario DroughtErica, RN went over discharge instructions with the patient and family. Discontinue peripheral IV and telemetry monitor. Sodium Phosphate drip is done. RN will help patient for transport.

## 2018-04-21 NOTE — Progress Notes (Signed)
Patient to be d/c per MD order after IV sodium Phosphate finish. Discharge instructions and prescriptions given to patient.

## 2018-04-21 NOTE — Care Management (Signed)
Patient to be discharged today per MD order. Patient without insurance, given resources including medication management application and [facility] information. Patient plans to follow up on Monday. Discharging medications are vitamins and [facility]. RNCM to sign off. Buddy DutyJosh Mekia Dipinto RN BSN RNCM [phone]

## 2018-04-21 NOTE — Discharge Summary (Signed)
[employer] - [place] [facility]   PATIENT NAME: Peggy Brown    MR#:  [id number]  DATE OF BIRTH:  02-18-1956  DATE OF ADMISSION:  04/19/2018 ADMITTING PHYSICIAN: Houston Siren, MD  DATE OF DISCHARGE: 04/21/2018  PRIMARY CARE PHYSICIAN: Patient, No Pcp Per    ADMISSION DIAGNOSIS:  Hypokalemia [E87.6] Hyponatremia [E87.1] SVT (supraventricular tachycardia) (HCC) [I47.1] Alcohol use disorder, severe, dependence (HCC) [F10.20]  DISCHARGE DIAGNOSIS:  Active Problems:   Hyponatremia   Protein-calorie malnutrition, severe   SECONDARY DIAGNOSIS:  History reviewed. No pertinent past medical history.  HOSPITAL COURSE:   1.  Hyponatremia secondary to dehydration and beer potomania.  Sodium has improved to 131 with hydration. 2.  Hypokalemia and hypomagnesemia and hypophosphatemia.  K-Phos given today.  Magnesium given orally.  Potassium was given an IV fluids.  Potassium now in the normal range.  Patient would like to go home after K-Phos IV today. 3.  Alcohol abuse.  I explained that this is the cause of the all the patient's issues with her electrolyte abnormalities.  I spent quite a bit of time with the patient explaining this.  I stated if she goes back to alcohol she will end up back in the hospital with electrolyte abnormalities. 4.  SVT in the emergency room requiring adenosine.  Patient now in normal sinus rhythm.  Likely secondary to electrolyte abnormalities.  I will give a low-dose Toprol 12.5 mg daily. 5.  Anemia of chronic disease with alcohol.  Could be bone marrow suppression with alcohol.  DISCHARGE CONDITIONS:   Fair  CONSULTS OBTAINED:  Treatment Team:  Lamont Dowdy, MD  DRUG ALLERGIES:  Not on File  DISCHARGE MEDICATIONS:   Allergies as of 04/21/2018   Not on File     Medication List    TAKE these medications   feeding supplement (ENSURE ENLIVE) Liqd Take 237 mLs by mouth 2 (two) times daily between meals.   folic acid 1 MG  tablet Commonly known as:  FOLVITE Take 1 tablet (1 mg total) by mouth daily. Start taking on:  04/22/2018   magnesium oxide 400 (241.3 Mg) MG tablet Commonly known as:  MAG-OX Take 1 tablet (400 mg total) by mouth daily.   metoprolol succinate 25 MG 24 hr tablet Commonly known as:  TOPROL-XL Take 0.5 tablets (12.5 mg total) by mouth daily.   multivitamin with minerals Tabs tablet Take 1 tablet by mouth daily. Start taking on:  04/22/2018   thiamine 100 MG tablet Take 1 tablet (100 mg total) by mouth daily. Start taking on:  04/22/2018        DISCHARGE INSTRUCTIONS:   Follow-up with Dr. on [facility] card 6 days  If you experience worsening of your admission symptoms, develop shortness of breath, life threatening emergency, suicidal or homicidal thoughts you must seek medical attention immediately by calling 911 or calling your MD immediately  if symptoms less severe.  You Must read complete instructions/literature along with all the possible adverse reactions/side effects for all the Medicines you take and that have been prescribed to you. Take any new Medicines after you have completely understood and accept all the possible adverse reactions/side effects.   Please note  You were cared for by a hospitalist during your hospital stay. If you have any questions about your discharge medications or the care you received while you were in the hospital after you are discharged, you can call the unit and asked to speak with the hospitalist on call if the  hospitalist that took care of you is not available. Once you are discharged, your primary care physician will handle any further medical issues. Please note that NO REFILLS for any discharge medications will be authorized once you are discharged, as it is imperative that you return to your primary care physician (or establish a relationship with a primary care physician if you do not have one) for your aftercare needs so that they can  reassess your need for medications and monitor your lab values.    Today   CHIEF COMPLAINT:   Chief Complaint  Patient presents with  . Weakness  . Altered Mental Status    HISTORY OF PRESENT ILLNESS:  Peggy Brown  is a 62 y.o. female presented with altered mental status and weakness and found to have electrolyte abnormalities and SVT.   VITAL SIGNS:  Blood pressure 104/60, pulse 95, temperature 98 F (36.7 C), temperature source Oral, resp. rate 18, height 5\' 1"  (1.549 m), weight 41.2 kg (90 lb 14.4 oz), SpO2 96 %.   PHYSICAL EXAMINATION:  GENERAL:  62 y.o.-year-old patient lying in the bed with no acute distress.  EYES: Pupils equal, round, reactive to light and accommodation. No scleral icterus. Extraocular muscles intact.  HEENT: Head atraumatic, normocephalic. Oropharynx and nasopharynx clear.  NECK:  Supple, no jugular venous distention. No thyroid enlargement, no tenderness.  LUNGS: Normal breath sounds bilaterally, no wheezing, rales,rhonchi or crepitation. No use of accessory muscles of respiration.  CARDIOVASCULAR: S1, S2 normal. No murmurs, rubs, or gallops.  ABDOMEN: Soft, non-tender, non-distended. Bowel sounds present. No organomegaly or mass.  EXTREMITIES: No pedal edema, cyanosis, or clubbing.  NEUROLOGIC: Cranial nerves II through XII are intact. Muscle strength 5/5 in all extremities. Sensation intact. Gait not checked.  PSYCHIATRIC: The patient is alert and oriented x 3.  SKIN: No obvious rash, lesion, or ulcer.   DATA REVIEW:   CBC Recent Labs  Lab 04/21/18 0708  WBC 9.1  HGB 8.5*  HCT 23.7*  PLT 169    Chemistries  Recent Labs  Lab 04/19/18 1632 04/19/18 1950  04/21/18 0708  NA  --   --    < > 131*  K  --   --    < > 4.1  CL  --   --    < > 105  CO2  --   --    < > 22  GLUCOSE  --   --    < > 86  BUN  --   --    < > <5*  CREATININE  --   --    < > 0.41*  CALCIUM  --   --    < > 7.6*  MG 1.7 1.8  --   --   AST 41  --   --   --    ALT 17  --   --   --   ALKPHOS 56  --   --   --   BILITOT 0.9  --   --   --    < > = values in this interval not displayed.    Cardiac Enzymes Recent Labs  Lab 04/19/18 1632  TROPONINI <0.03     RADIOLOGY:  Dg Chest 1 View  Result Date: 04/19/2018 CLINICAL DATA:  Confusion and weakness EXAM: CHEST  1 VIEW COMPARISON:  June 17, 2014 FINDINGS: There is no evident edema or consolidation. Heart size and pulmonary vascularity are normal. No adenopathy. No bone lesions. IMPRESSION: No edema  or consolidation. Electronically Signed   By: Bretta BangWilliam  Woodruff III M.D.   On: 04/19/2018 16:35      Management plans discussed with the patient, and she is in agreement.  Patient refused for me to call family.  CODE STATUS:     Code Status Orders  (From admission, onward)        Start     Ordered   04/19/18 1846  Full code  Continuous     04/19/18 1846    Code Status History    This patient has a current code status but no historical code status.      TOTAL TIME TAKING CARE OF THIS PATIENT: 40 minutes.    Alford Highlandichard Precious Segall M.D on 04/21/2018 at 1:25 PM  Between 7am to 6pm - Pager - [phone]  After 6pm go to [email or web address] - password [email or web address] [facility]  [employer] Office  [phone]  CC: Primary care physician; Patient, No Pcp Per

## 2018-04-21 NOTE — Plan of Care (Signed)
Patient is impulsive at times and forgets instructions for safety measures. Low bed and mats has been ordered. Patient continues to push buttons on the iv pump, patient has been instructed about only staff members are responsible for the operating the pump. Patient is forgetful and has to be redirected. Patient scores an 8 on the CIWA scale. Does not require medicinal intervention at this time. Will continue to monitor for signs of detox.

## 2018-04-21 NOTE — Progress Notes (Signed)
Pharmacy Electrolyte Monitoring Consult:  Pharmacy consulted to assist in monitoring and replacing electrolytes in this 62 y.o. female admitted on 04/19/2018 with Weakness and Altered Mental Status  IBW = 47.8 kg   Labs:  Sodium (mmol/L)  Date Value  04/21/2018 131 (L)  06/17/2014 141   Potassium (mmol/L)  Date Value  04/21/2018 4.1  06/17/2014 3.6   Magnesium (mg/dL)  Date Value  16/10/960407/18/2019 1.8   Phosphorus (mg/dL)  Date Value  54/09/811907/20/2019 1.7 (L)   Calcium (mg/dL)  Date Value  14/78/295607/20/2019 7.6 (L)   Calcium, Total (mg/dL)  Date Value  21/30/865709/15/2015 8.0 (L)   Albumin (g/dL)  Date Value  84/69/629507/18/2019 2.5 (L)  06/17/2014 2.9 (L)    Assessment/Plan:   Sodium phosphate 15mmol IV x 1 dose.  Check electrolytes with am labs.   Stormy CardKatsoudas,Carla Whilden K, [facility] 04/21/2018 9:09 AM

## 2018-04-21 NOTE — Discharge Instructions (Signed)

## 2018-04-21 NOTE — Progress Notes (Signed)
[facility]  ROUNDING NOTE   Subjective:   Na 131 (130) NS with 20mEq KCl at 7475mL/hr  Sitting up in bed, eating breakfast.   Objective:  Vital signs in last 24 hours:  Temp:  [97.6 F (36.4 C)-98.2 F (36.8 C)] 98 F (36.7 C) (07/20 0833) Pulse Rate:  [76-96] 76 (07/20 0833) Resp:  [18] 18 (07/20 0833) BP: (94-113)/(66-73) 111/66 (07/20 0833) SpO2:  [97 %-99 %] 97 % (07/20 0833)  Weight change: -8.664 kg (-19 lb 1.6 oz) Filed Weights   04/19/18 1506 04/20/18 1013  Weight: 49.9 kg (110 lb) 41.2 kg (90 lb 14.4 oz)    Intake/Output: I/O last 3 completed shifts: In: 1100 [I.V.:1100] Out: 2450 [Urine:2450]   Intake/Output this shift:  Total I/O In: -  Out: 400 [Urine:400]  Physical Exam: General: NAD,   Head: Normocephalic, atraumatic. Moist oral mucosal membranes  Eyes: Anicteric, PERRL  Neck: Supple, trachea midline  Lungs:  Clear to auscultation  Heart: Regular rate and rhythm  Abdomen:  Soft, nontender,   Extremities:  no peripheral edema.  Neurologic: Nonfocal, moving all four extremities  Skin: No lesions        Basic Metabolic Panel: Recent Labs  Lab 04/19/18 1513 04/19/18 1632 04/19/18 1950 04/20/18 0436 04/20/18 1414 04/21/18 0708  NA 117*  --   --  130*  --  131*  K 2.4*  --   --  4.0  --  4.1  CL 82*  --   --  103  --  105  CO2 20*  --   --  24  --  22  GLUCOSE 138*  --   --  92  --  86  BUN 7*  --   --  6*  --  <5*  CREATININE 0.60  --   --  0.40*  --  0.41*  CALCIUM 8.1*  --   --  7.5*  --  7.6*  MG  --  1.7 1.8  --   --   --   PHOS  --   --   --   --  1.8* 1.7*    Liver Function Tests: Recent Labs  Lab 04/19/18 1632  AST 41  ALT 17  ALKPHOS 56  BILITOT 0.9  PROT 5.4*  ALBUMIN 2.5*   No results for input(s): LIPASE, AMYLASE in the last 168 hours. No results for input(s): AMMONIA in the last 168 hours.  CBC: Recent Labs  Lab 04/19/18 1513 04/20/18 0436 04/21/18 0708  WBC 12.9* 9.5 9.1  HGB 12.0 9.7* 8.5*   HCT 34.4* 27.9* 23.7*  MCV 98.8 99.3 99.2  PLT 206 154 169    Cardiac Enzymes: Recent Labs  Lab 04/19/18 1632  TROPONINI <0.03    BNP: Invalid input(s): POCBNP  CBG: No results for input(s): GLUCAP in the last 168 hours.  Microbiology: No results found for this or any previous visit.  Coagulation Studies: Recent Labs    04/19/18 1632  LABPROT 11.7  INR 0.87    Urinalysis: Recent Labs    04/19/18 1513  COLORURINE YELLOW*  LABSPEC 1.012  PHURINE 5.0  GLUCOSEU NEGATIVE  HGBUR NEGATIVE  BILIRUBINUR NEGATIVE  KETONESUR 5*  PROTEINUR NEGATIVE  NITRITE NEGATIVE  LEUKOCYTESUR NEGATIVE      Imaging: Dg Chest 1 View  Result Date: 04/19/2018 CLINICAL DATA:  Confusion and weakness EXAM: CHEST  1 VIEW COMPARISON:  June 17, 2014 FINDINGS: There is no evident edema or consolidation. Heart size and pulmonary  vascularity are normal. No adenopathy. No bone lesions. IMPRESSION: No edema or consolidation. Electronically Signed   By: Bretta Bang III M.D.   On: 04/19/2018 16:35     Medications:   . sodium phosphate  Dextrose 5% IVPB 15 mmol (04/21/18 0958)   . feeding supplement (ENSURE ENLIVE)  237 mL Oral BID BM  . folic acid  1 mg Oral Daily  . magnesium oxide  400 mg Oral BID  . metoprolol succinate  12.5 mg Oral Daily  . multivitamin with minerals  1 tablet Oral Daily  . thiamine  100 mg Oral Daily   Or  . thiamine  100 mg Intravenous Daily   acetaminophen **OR** acetaminophen, LORazepam **OR** LORazepam, ondansetron **OR** ondansetron (ZOFRAN) IV  Assessment/ Plan:  [name] is a 62 y.o. black female with with EtOH abuse, and tobacco abuse, who was admitted to [facility] on 04/19/2018 for supraventricular tachycardia  1. Hyponatremia 2. Hypokalemia 3. Metabolic acidosis 4. Anemia - normocytic  Plan Hypovolemic hyponatremia secondary beer intoxication - Continue IV NS with potassium  - replace magnesium PO - Encourage PO intake.  -  CIWA protocol.    LOS: 2 Peggy Brown 7/20/201911:07 AM

## 2018-04-25 ENCOUNTER — Telehealth: Payer: Self-pay | Admitting: *Deleted

## 2018-04-25 NOTE — Telephone Encounter (Signed)
EMMI follow up as not having filled prescriptions.  Patient without insurance.  Note from weekend CM that discharge meds vitamins and metoprolol (which is on the [facility] four dollar list). Left voicemail message to patient to return call to discuss.

## 2018-04-26 ENCOUNTER — Telehealth: Payer: Self-pay

## 2018-04-26 NOTE — Telephone Encounter (Signed)
Flagged on EMMI report for having unfilled prescriptions. Spoke with patient who confirmed she would fill her metoprolol today.  No other questions currently.  I thanked her for her time and informed her that she would receive one more automated call checking on her in the next few days.

## 2018-04-30 ENCOUNTER — Telehealth: Payer: Self-pay

## 2018-04-30 NOTE — Telephone Encounter (Signed)
Flagged on EMMI report for having other questions or problems.  Called and spoke with patient.  She mentioned she doesn't really have any problems, however did wonder when and who she was supposed to follow up with.  She mentioned she does have insurance through her ex-husband's coverage for now and that paperwork started regarding Medicare/Medicaid.  She does not have a PCP.  Relayed numbers for [place] and the main number for [facility] and encouraged her to check with them to see about who's accepting patients in her preferred area.  No other questions at this time.

## 2019-10-21 ENCOUNTER — Emergency Department
Admission: EM | Admit: 2019-10-21 | Discharge: 2019-10-21 | Disposition: A | Discharge status: Home / Self Care | Payer: Medicare Other | Attending: Emergency Medicine | Admitting: Emergency Medicine

## 2019-10-21 ENCOUNTER — Encounter: Payer: Self-pay | Admitting: Emergency Medicine

## 2019-10-21 ENCOUNTER — Other Ambulatory Visit: Payer: Self-pay

## 2019-10-21 DIAGNOSIS — F17210 Nicotine dependence, cigarettes, uncomplicated: Secondary | ICD-10-CM | POA: Diagnosis not present

## 2019-10-21 DIAGNOSIS — E876 Hypokalemia: Secondary | ICD-10-CM | POA: Insufficient documentation

## 2019-10-21 DIAGNOSIS — R55 Syncope and collapse: Secondary | ICD-10-CM

## 2019-10-21 DIAGNOSIS — R52 Pain, unspecified: Secondary | ICD-10-CM | POA: Diagnosis present

## 2019-10-21 DIAGNOSIS — Z79899 Other long term (current) drug therapy: Secondary | ICD-10-CM | POA: Insufficient documentation

## 2019-10-21 LAB — BASIC METABOLIC PANEL
Anion gap: 14 (ref 5–15)
BUN: <5 mg/dL — ABNORMAL LOW (ref 8–23)
CO2: 23 mmol/L (ref 22–32)
Calcium: 8 mg/dL — ABNORMAL LOW (ref 8.9–10.3)
Chloride: 93 mmol/L — ABNORMAL LOW (ref 98–111)
Creatinine, Ser: 0.64 mg/dL (ref 0.44–1.00)
GFR calc Af Amer: >60 mL/min (ref >60)
GFR calc non Af Amer: >60 mL/min (ref >60)
Glucose, Bld: 128 mg/dL — ABNORMAL HIGH (ref 70–99)
Potassium: 2.6 mmol/L — CL (ref 3.5–5.1)
Sodium: 130 mmol/L — ABNORMAL LOW (ref 135–145)

## 2019-10-21 LAB — URINALYSIS, COMPLETE (UACMP) WITH MICROSCOPIC
Bacteria, UA: NONE SEEN
Bilirubin Urine: NEGATIVE
Glucose, UA: 50 mg/dL — AB
Hgb urine dipstick: NEGATIVE
Ketones, ur: 5 mg/dL — AB
Nitrite: NEGATIVE
Protein, ur: 30 mg/dL — AB
Specific Gravity, Urine: 1.021 (ref 1.005–1.030)
pH: 5 (ref 5.0–8.0)

## 2019-10-21 LAB — CBC
HCT: 40.2 % (ref 36.0–46.0)
Hemoglobin: 13.6 g/dL (ref 12.0–15.0)
MCH: 32.7 pg (ref 26.0–34.0)
MCHC: 33.8 g/dL (ref 30.0–36.0)
MCV: 96.6 fL (ref 80.0–100.0)
Platelets: 196 10*3/uL (ref 150–400)
RBC: 4.16 MIL/uL (ref 3.87–5.11)
RDW: 13.3 % (ref 11.5–15.5)
WBC: 13.6 10*3/uL — ABNORMAL HIGH (ref 4.0–10.5)
nRBC: 0 % (ref 0.0–0.2)

## 2019-10-21 LAB — TROPONIN I (HIGH SENSITIVITY)
Troponin I (High Sensitivity): <2 ng/L (ref <18)
Troponin I (High Sensitivity): <2 ng/L (ref <18)

## 2019-10-21 MED ORDER — POTASSIUM CHLORIDE 10 MEQ/100ML IV SOLN
10.0000 meq | Freq: Once | INTRAVENOUS | Status: AC
  Administered 2019-10-21: 10 meq via INTRAVENOUS
  Filled 2019-10-21: qty 100

## 2019-10-21 MED ORDER — SODIUM CHLORIDE 0.9% FLUSH: 3.0000 mL | Freq: Once | INTRAVENOUS | Status: DC

## 2019-10-21 MED ORDER — SODIUM CHLORIDE 0.9 % IV BOLUS
1000.0000 mL | Freq: Once | INTRAVENOUS | Status: AC
  Administered 2019-10-21: 16:00:00 1000 mL via INTRAVENOUS

## 2019-10-21 MED ORDER — POTASSIUM CHLORIDE ER 10 MEQ PO TBCR: 10.0000 meq | EXTENDED_RELEASE_TABLET | Freq: Two times a day (BID) | ORAL | 0 refills | Status: DC

## 2019-10-21 NOTE — ED Triage Notes (Signed)
States was walking to bathroom about 15 min prior to arrival and felt "swimmy headed". States did fall to floor but did not lose consciousness. Denies chest pain. States feels a little lightheaded now. No headache.

## 2019-10-21 NOTE — ED Provider Notes (Signed)
[facility] [place] Emergency Department Provider Note ____________________________________________   First MD Initiated Contact with Patient 10/21/19 1534     (approximate)  I have reviewed the triage vital signs and the nursing notes.   HISTORY  Chief Complaint Near Syncope    HPI Peggy Brown is a 64 y.o. female with PMH as noted below who presents with dizziness and a fall just prior to coming to the ER.  The patient states that she was sleeping on the sofa, got up quickly, and then as she started to walk she felt like her knee gave out and she fell to the ground.  She states that this has happened previously.  She states that she felt "swimmy headed" when she got up but denies any dizziness now.  The patient states she was feeling well earlier today.  She denies any chest pain or difficulty breathing, vomiting or diarrhea, but does state that she has loose stools chronically.  She states she drinks most days, and last had alcohol yesterday.  History reviewed. No pertinent past medical history.  Patient Active Problem List   Diagnosis Date Noted  . Protein-calorie malnutrition, severe 04/21/2018  . Hyponatremia 04/19/2018    Past Surgical History:  Procedure Laterality Date  . CESAREAN SECTION      Prior to Admission medications   Medication Sig Start Date End Date Taking? Authorizing Provider  ibuprofen (ADVIL) 200 MG tablet Take 200-600 mg by mouth every 6 (six) hours as needed for fever or mild pain.   Yes [provider]  potassium chloride (KLOR-CON) 10 MEQ tablet Take 1 tablet (10 mEq total) by mouth 2 (two) times daily for 7 days. 10/21/19 10/28/19  Dionne Bucy, MD    Allergies Patient has no known allergies.  Family History  Problem Relation Age of Onset  . Hypertension Mother   . Thyroid cancer Mother   . COPD Father     Social History Social History   Tobacco Use  . Smoking status: Current Every Day Smoker   Packs/day: 0.50    Years: 45.00    Pack years: 22.50    Types: Cigarettes  . Smokeless tobacco: Never Used  Substance Use Topics  . Alcohol use: Yes    Alcohol/week: 2.0 standard drinks    Types: 2 Cans of beer per week  . Drug use: Never    Review of Systems  Constitutional: No fever. Eyes: No redness. ENT: No sore throat. Cardiovascular: Denies chest pain. Respiratory: Denies shortness of breath. Gastrointestinal: No vomiting. Genitourinary: Negative for dysuria.  Musculoskeletal: Negative for back pain. Skin: Negative for rash. Neurological: Negative for headaches, focal weakness or numbness.   ____________________________________________   PHYSICAL EXAM:  VITAL SIGNS: ED Triage Vitals  Enc Vitals Group     BP 10/21/19 1433 123/74     Pulse Rate 10/21/19 1433 93     Resp 10/21/19 1433 18     Temp 10/21/19 1433 98 F (36.7 C)     Temp Source 10/21/19 1433 Oral     SpO2 10/21/19 1433 98 %     Weight 10/21/19 1437 110 lb (49.9 kg)     Height 10/21/19 1437 5\' 1"  (1.549 m)     Head Circumference --      Peak Flow --      Pain Score 10/21/19 1437 1     Pain Loc --      Pain Edu? --      Excl. in GC? --  Constitutional: Alert and oriented.  Relatively well appearing and in no acute distress. Eyes: Conjunctivae are normal.  EOMI.  PERRLA. Head: Atraumatic. Nose: No congestion/rhinnorhea. Mouth/Throat: Mucous membranes are slightly dry.   Neck: Normal range of motion.  Cardiovascular: Borderline tachycardia, regular rhythm.   Good peripheral circulation. Respiratory: Normal respiratory effort.  No retractions.  Gastrointestinal: No distention.  Musculoskeletal: Extremities warm and well perfused.  Right knee with full range of motion and no bony tenderness. Neurologic:  Normal speech and language.  Motor and sensory intact in all extremities.  Normal coordination.  No ataxia on finger-to-nose.   Skin:  Skin is warm and dry. No rash noted. Psychiatric: Mood  and affect are normal. Speech and behavior are normal.  ____________________________________________   LABS (all labs ordered are listed, but only abnormal results are displayed)  Labs Reviewed  BASIC METABOLIC PANEL - Abnormal; Notable for the following components:      Result Value   Sodium 130 (*)    Potassium 2.6 (*)    Chloride 93 (*)    Glucose, Bld 128 (*)    BUN <5 (*)    Calcium 8.0 (*)    All other components within normal limits  CBC - Abnormal; Notable for the following components:   WBC 13.6 (*)    All other components within normal limits  URINALYSIS, COMPLETE (UACMP) WITH MICROSCOPIC - Abnormal; Notable for the following components:   Color, Urine AMBER (*)    APPearance CLOUDY (*)    Glucose, UA 50 (*)    Ketones, ur 5 (*)    Protein, ur 30 (*)    Leukocytes,Ua TRACE (*)    All other components within normal limits  TROPONIN I (HIGH SENSITIVITY)  TROPONIN I (HIGH SENSITIVITY)   ____________________________________________  EKG  ED ECG REPORT I, Arta Silence, the attending physician, personally viewed and interpreted this ECG.  Date: 10/21/2019 EKG Time: 1443 Rate: 92 Rhythm: normal sinus rhythm QRS Axis: normal Intervals: normal ST/T Wave abnormalities: Nonspecific ST segment abnormality diffusely  Narrative Interpretation: no evidence of acute ischemia; no significant change when compared to EKG of 04/19/2018  ____________________________________________  RADIOLOGY    ____________________________________________   PROCEDURES  Procedure(s) performed: No  Procedures  Critical Care performed: No ____________________________________________   INITIAL IMPRESSION / ASSESSMENT AND PLAN / ED COURSE  Pertinent labs & imaging results that were available during my care of the patient were reviewed by me and considered in my medical decision making (see chart for details).  64 year old female with PMH as noted above presents with a near  syncopal type event after she got up from lying down on a couch, and feels like her right knee gave out.  She has had similar episodes of the leg giving out previously.  She denies any other significant weakness, dizziness, or other symptoms earlier today.  I reviewed the past medical records in [place].  The patient was most recently admitted to the hospital last year with hypokalemia, hyponatremia, and SVT.  She has a history of alcohol abuse and this was suspected to be the cause of her electrolyte abnormalities.  On exam today, the patient is well-appearing and her vital signs are normal except for borderline tachycardia.  Neurologic exam is nonfocal.  There is no evidence of trauma.  She has good range of motion and no tenderness at the right knee.  Her EKG shows no acute abnormalities.  Initial work-up obtained from triage reveals hypokalemia but no other significant electrolyte abnormalities.  Overall the presentation is consistent with orthostatic or vasovagal type near syncopal event, and the low potassium may be contributing.  I also suspect that the patient is dehydrated.  She reports last drinking yesterday.  She has no clinical signs or symptoms of withdrawal.  We will give fluids, replete potassium, and reassess.  Anticipate discharge home.  ----------------------------------------- 7:11 PM on 10/21/2019 -----------------------------------------  The patient is feeling well.  Her vital signs are normal.  At this time, she is stable for discharge home.  There is no indication for repeat potassium level in the ED.  I will prescribe oral potassium for further repletion.  I gave her thorough return precautions and she expressed understanding.  ____________________________________________   FINAL CLINICAL IMPRESSION(S) / ED DIAGNOSES  Final diagnoses:  Near syncope  Hypokalemia      NEW MEDICATIONS STARTED DURING THIS VISIT:  New Prescriptions   POTASSIUM CHLORIDE (KLOR-CON) 10  MEQ TABLET    Take 1 tablet (10 mEq total) by mouth 2 (two) times daily for 7 days.     Note:  This document was prepared using Dragon voice recognition software and may include unintentional dictation errors.    Dionne Bucy, MD 10/21/19 1911

## 2019-10-21 NOTE — Discharge Instructions (Signed)
Take the potassium as prescribed.  Make sure to drink plenty of fluids and eat small amounts regularly throughout the day.  Return to the ER for new, worsening, or persistent severe dizziness, recurrent episodes of falling or feeling like you are going to pass out, or any other new or worsening symptoms that concern you.

## 2019-10-21 NOTE — ED Notes (Signed)
Pt states she called daughter for ride, ascom at bedside that pt was using. Pt instructed to phone daughter again for ride because she was being discharged pt states she wants to wait for daughter in lobby at this time and does not need to call daughter.

## 2021-04-25 ENCOUNTER — Inpatient Hospital Stay
Admission: EM | Admit: 2021-04-25 | Discharge: 2021-04-30 | DRG: 377 | Disposition: A | Discharge status: Skilled Nursing Facility | Payer: Medicare Other | Attending: Internal Medicine | Admitting: Internal Medicine

## 2021-04-25 ENCOUNTER — Other Ambulatory Visit: Payer: Self-pay

## 2021-04-25 ENCOUNTER — Emergency Department: Payer: Medicare Other

## 2021-04-25 DIAGNOSIS — I959 Hypotension, unspecified: Secondary | ICD-10-CM | POA: Diagnosis present

## 2021-04-25 DIAGNOSIS — D6959 Other secondary thrombocytopenia: Secondary | ICD-10-CM | POA: Diagnosis present

## 2021-04-25 DIAGNOSIS — K225 Diverticulum of esophagus, acquired: Secondary | ICD-10-CM | POA: Diagnosis present

## 2021-04-25 DIAGNOSIS — R531 Weakness: Secondary | ICD-10-CM | POA: Diagnosis present

## 2021-04-25 DIAGNOSIS — I471 Supraventricular tachycardia: Secondary | ICD-10-CM | POA: Diagnosis present

## 2021-04-25 DIAGNOSIS — F1010 Alcohol abuse, uncomplicated: Secondary | ICD-10-CM | POA: Diagnosis not present

## 2021-04-25 DIAGNOSIS — D696 Thrombocytopenia, unspecified: Secondary | ICD-10-CM

## 2021-04-25 DIAGNOSIS — I5031 Acute diastolic (congestive) heart failure: Secondary | ICD-10-CM | POA: Diagnosis not present

## 2021-04-25 DIAGNOSIS — E871 Hypo-osmolality and hyponatremia: Secondary | ICD-10-CM | POA: Diagnosis present

## 2021-04-25 DIAGNOSIS — E876 Hypokalemia: Secondary | ICD-10-CM | POA: Diagnosis present

## 2021-04-25 DIAGNOSIS — R634 Abnormal weight loss: Secondary | ICD-10-CM

## 2021-04-25 DIAGNOSIS — K641 Second degree hemorrhoids: Secondary | ICD-10-CM | POA: Diagnosis present

## 2021-04-25 DIAGNOSIS — D62 Acute posthemorrhagic anemia: Secondary | ICD-10-CM | POA: Diagnosis present

## 2021-04-25 DIAGNOSIS — F17210 Nicotine dependence, cigarettes, uncomplicated: Secondary | ICD-10-CM | POA: Diagnosis present

## 2021-04-25 DIAGNOSIS — E860 Dehydration: Secondary | ICD-10-CM | POA: Diagnosis present

## 2021-04-25 DIAGNOSIS — Z8249 Family history of ischemic heart disease and other diseases of the circulatory system: Secondary | ICD-10-CM | POA: Diagnosis not present

## 2021-04-25 DIAGNOSIS — Z9114 Patient's other noncompliance with medication regimen: Secondary | ICD-10-CM

## 2021-04-25 DIAGNOSIS — Z825 Family history of asthma and other chronic lower respiratory diseases: Secondary | ICD-10-CM

## 2021-04-25 DIAGNOSIS — Z20822 Contact with and (suspected) exposure to covid-19: Secondary | ICD-10-CM | POA: Diagnosis present

## 2021-04-25 DIAGNOSIS — F1020 Alcohol dependence, uncomplicated: Secondary | ICD-10-CM | POA: Diagnosis present

## 2021-04-25 DIAGNOSIS — E8809 Other disorders of plasma-protein metabolism, not elsewhere classified: Secondary | ICD-10-CM | POA: Diagnosis present

## 2021-04-25 DIAGNOSIS — R748 Abnormal levels of other serum enzymes: Secondary | ICD-10-CM

## 2021-04-25 DIAGNOSIS — K2971 Gastritis, unspecified, with bleeding: Secondary | ICD-10-CM | POA: Diagnosis present

## 2021-04-25 DIAGNOSIS — Z808 Family history of malignant neoplasm of other organs or systems: Secondary | ICD-10-CM

## 2021-04-25 DIAGNOSIS — K635 Polyp of colon: Secondary | ICD-10-CM | POA: Diagnosis present

## 2021-04-25 DIAGNOSIS — D638 Anemia in other chronic diseases classified elsewhere: Secondary | ICD-10-CM | POA: Diagnosis present

## 2021-04-25 DIAGNOSIS — E43 Unspecified severe protein-calorie malnutrition: Secondary | ICD-10-CM | POA: Diagnosis present

## 2021-04-25 LAB — IRON AND TIBC
Iron: 63 ug/dL (ref 28–170)
Saturation Ratios: 52 % — ABNORMAL HIGH (ref 10.4–31.8)
TIBC: 122 ug/dL — ABNORMAL LOW (ref 250–450)
UIBC: 59 ug/dL

## 2021-04-25 LAB — RETICULOCYTES
Immature Retic Fract: 26 % — ABNORMAL HIGH (ref 2.3–15.9)
RBC.: 1.56 MIL/uL — ABNORMAL LOW (ref 3.87–5.11)
Retic Count, Absolute: 89.1 10*3/uL (ref 19.0–186.0)
Retic Ct Pct: 5.7 % — ABNORMAL HIGH (ref 0.4–3.1)

## 2021-04-25 LAB — COMPREHENSIVE METABOLIC PANEL
ALT: 12 U/L (ref 0–44)
AST: 32 U/L (ref 15–41)
Albumin: 2.1 g/dL — ABNORMAL LOW (ref 3.5–5.0)
Alkaline Phosphatase: 87 U/L (ref 38–126)
Anion gap: 9 (ref 5–15)
BUN: 11 mg/dL (ref 8–23)
CO2: 23 mmol/L (ref 22–32)
Calcium: 7.7 mg/dL — ABNORMAL LOW (ref 8.9–10.3)
Chloride: 93 mmol/L — ABNORMAL LOW (ref 98–111)
Creatinine, Ser: 0.63 mg/dL (ref 0.44–1.00)
GFR, Estimated: >60 mL/min (ref >60)
Glucose, Bld: 132 mg/dL — ABNORMAL HIGH (ref 70–99)
Potassium: 2.8 mmol/L — ABNORMAL LOW (ref 3.5–5.1)
Sodium: 125 mmol/L — ABNORMAL LOW (ref 135–145)
Total Bilirubin: 1.1 mg/dL (ref 0.3–1.2)
Total Protein: 5.6 g/dL — ABNORMAL LOW (ref 6.5–8.1)

## 2021-04-25 LAB — CBC WITH DIFFERENTIAL/PLATELET
Abs Immature Granulocytes: 0.12 10*3/uL — ABNORMAL HIGH (ref 0.00–0.07)
Basophils Absolute: 0.1 10*3/uL (ref 0.0–0.1)
Basophils Relative: 0 %
Eosinophils Absolute: 0 10*3/uL (ref 0.0–0.5)
Eosinophils Relative: 0 %
HCT: 18.2 % — ABNORMAL LOW (ref 36.0–46.0)
Hemoglobin: 6.8 g/dL — ABNORMAL LOW (ref 12.0–15.0)
Immature Granulocytes: 1 %
Lymphocytes Relative: 19 %
Lymphs Abs: 2.8 10*3/uL (ref 0.7–4.0)
MCH: 34.2 pg — ABNORMAL HIGH (ref 26.0–34.0)
MCHC: 37.4 g/dL — ABNORMAL HIGH (ref 30.0–36.0)
MCV: 91.5 fL (ref 80.0–100.0)
Monocytes Absolute: 0.8 10*3/uL (ref 0.1–1.0)
Monocytes Relative: 5 %
Neutro Abs: 11 10*3/uL — ABNORMAL HIGH (ref 1.7–7.7)
Neutrophils Relative %: 75 %
Platelets: 137 10*3/uL — ABNORMAL LOW (ref 150–400)
RBC: 1.99 MIL/uL — ABNORMAL LOW (ref 3.87–5.11)
RDW: 15.3 % (ref 11.5–15.5)
WBC: 14.7 10*3/uL — ABNORMAL HIGH (ref 4.0–10.5)
nRBC: 1.6 % — ABNORMAL HIGH (ref 0.0–0.2)

## 2021-04-25 LAB — ABO/RH: ABO/RH(D): O POS

## 2021-04-25 LAB — FERRITIN: Ferritin: 406 ng/mL — ABNORMAL HIGH (ref 11–307)

## 2021-04-25 LAB — SODIUM: Sodium: 128 mmol/L — ABNORMAL LOW (ref 135–145)

## 2021-04-25 LAB — PHOSPHORUS: Phosphorus: 3.1 mg/dL (ref 2.5–4.6)

## 2021-04-25 LAB — MAGNESIUM: Magnesium: 1.9 mg/dL (ref 1.7–2.4)

## 2021-04-25 LAB — TSH: TSH: 2.918 u[IU]/mL (ref 0.350–4.500)

## 2021-04-25 LAB — ETHANOL: Alcohol, Ethyl (B): <10 mg/dL (ref <10)

## 2021-04-25 LAB — FOLATE: Folate: 10.6 ng/mL (ref >5.9)

## 2021-04-25 MED ORDER — LORAZEPAM 2 MG/ML IJ SOLN: 1.0000 mg | INTRAMUSCULAR | Status: AC | PRN

## 2021-04-25 MED ORDER — PANTOPRAZOLE SODIUM 40 MG IV SOLR
40.0000 mg | Freq: Two times a day (BID) | INTRAVENOUS | Status: DC
  Administered 2021-04-25 – 2021-04-30 (×10): 40 mg via INTRAVENOUS
  Filled 2021-04-25 (×10): qty 40

## 2021-04-25 MED ORDER — SODIUM CHLORIDE 0.9 % IV SOLN
10.0000 mL/h | Freq: Once | INTRAVENOUS | Status: AC
  Administered 2021-04-25: 10 mL/h via INTRAVENOUS

## 2021-04-25 MED ORDER — THIAMINE HCL 100 MG/ML IJ SOLN
100.0000 mg | Freq: Once | INTRAMUSCULAR | Status: AC
  Administered 2021-04-25: 100 mg via INTRAVENOUS
  Filled 2021-04-25: qty 2

## 2021-04-25 MED ORDER — FENTANYL CITRATE (PF) 100 MCG/2ML IJ SOLN
INTRAMUSCULAR | Status: AC
  Filled 2021-04-25: qty 2

## 2021-04-25 MED ORDER — SODIUM CHLORIDE 0.9 % IV BOLUS: 1000.0000 mL | Freq: Once | INTRAVENOUS | Status: DC

## 2021-04-25 MED ORDER — SODIUM CHLORIDE 0.9 % IV BOLUS
1000.0000 mL | Freq: Once | INTRAVENOUS | Status: AC
  Administered 2021-04-25: 1000 mL via INTRAVENOUS

## 2021-04-25 MED ORDER — POTASSIUM CHLORIDE 10 MEQ/100ML IV SOLN
10.0000 meq | INTRAVENOUS | Status: AC
Start: 2021-04-25 — End: 2021-04-26
  Administered 2021-04-25 – 2021-04-26 (×3): 10 meq via INTRAVENOUS
  Filled 2021-04-25 (×3): qty 100

## 2021-04-25 MED ORDER — FOLIC ACID 1 MG PO TABS
1.0000 mg | ORAL_TABLET | Freq: Every day | ORAL | Status: DC
  Administered 2021-04-25 – 2021-04-30 (×6): 1 mg via ORAL
  Filled 2021-04-25 (×6): qty 1

## 2021-04-25 MED ORDER — ADULT MULTIVITAMIN W/MINERALS CH
1.0000 | ORAL_TABLET | Freq: Every day | ORAL | Status: DC
  Administered 2021-04-26 – 2021-04-30 (×5): 1 via ORAL
  Filled 2021-04-25 (×5): qty 1

## 2021-04-25 MED ORDER — THIAMINE HCL 100 MG PO TABS
100.0000 mg | ORAL_TABLET | Freq: Every day | ORAL | Status: DC
  Administered 2021-04-26 – 2021-04-30 (×5): 100 mg via ORAL
  Filled 2021-04-25 (×5): qty 1

## 2021-04-25 MED ORDER — POTASSIUM CHLORIDE CRYS ER 20 MEQ PO TBCR
40.0000 meq | EXTENDED_RELEASE_TABLET | Freq: Once | ORAL | Status: AC
  Administered 2021-04-25: 40 meq via ORAL
  Filled 2021-04-25: qty 2

## 2021-04-25 MED ORDER — THIAMINE HCL 100 MG/ML IJ SOLN: 100.0000 mg | Freq: Every day | INTRAMUSCULAR | Status: DC

## 2021-04-25 MED ORDER — LORAZEPAM 1 MG PO TABS
1.0000 mg | ORAL_TABLET | ORAL | Status: AC | PRN
Start: 2021-04-25 — End: 2021-04-28

## 2021-04-25 NOTE — H&P (Addendum)
Chief Complaint: Generalized weakness.  Brought in by daughter HPI:  Peggy Brown is a 65 y.o. female with known medical history of chronic alcohol abuse, tobacco dependence, history of SVT in the past.  Patient is not on any medications and does not have any established medical follow-up.  She admits to drinking about 2-3 bottles of 40 ounces of beer every day.  Her daughter apparently removed all her drinks from her over the past several days.  Patient has since had poor oral intake and felt generally weak.  Today, she was reported to have been extremely weak and not looking right , hence daughter decided to bring her into the emergency room to be evaluated.    ER course: Patient was noted to be intermittently unresponsive and was scheduled to alcohol use to treat likely hypotensive with systolic blood pressure in the 60s on presentation.  She was also noted to have tachyarrhythmia with heart rate in 200s.  EKG revealed evidence of SVT.  Patient was placed in a Trendelenburg position, IV fluid boluses were given.  Patient spontaneously converted into sinus rhythm without any intervention.  She remains in sinus rhythm.  Labs were significant for hemoglobin of 6.8 from previous peak of 13.5 on last admission.  Serum potassium and sodium were also significantly low.  Patient is being admitted for further evaluation and management.  No past medical history on file.  Past Surgical History:  Procedure Laterality Date   CESAREAN SECTION      Family History  Problem Relation Age of Onset   Hypertension Mother    Thyroid cancer Mother    COPD Father    Social History:  reports that she has been smoking cigarettes. She has a 22.50 pack-year smoking history. She has never used smokeless tobacco. She reports current alcohol use of about 2.0 standard drinks of alcohol per week. She reports that she does not use drugs.  Allergies: No Known Allergies  (Not in a hospital admission)   Results  for orders placed or performed during the hospital encounter of 04/25/21 (from the past 48 hour(s))  CBC with Differential     Status: Abnormal   Collection Time: 04/25/21  6:44 PM  Result Value Ref Range   WBC 14.7 (H) 4.0 - 10.5 K/uL   RBC 1.99 (L) 3.87 - 5.11 MIL/uL   Hemoglobin 6.8 (L) 12.0 - 15.0 g/dL   HCT 24.4 (L) 01.0 - 27.2 %   MCV 91.5 80.0 - 100.0 fL   MCH 34.2 (H) 26.0 - 34.0 pg   MCHC 37.4 (H) 30.0 - 36.0 g/dL   RDW 53.6 64.4 - 03.4 %   Platelets 137 (L) 150 - 400 K/uL   nRBC 1.6 (H) 0.0 - 0.2 %   Neutrophils Relative % 75 %   Neutro Abs 11.0 (H) 1.7 - 7.7 K/uL   Lymphocytes Relative 19 %   Lymphs Abs 2.8 0.7 - 4.0 K/uL   Monocytes Relative 5 %   Monocytes Absolute 0.8 0.1 - 1.0 K/uL   Eosinophils Relative 0 %   Eosinophils Absolute 0.0 0.0 - 0.5 K/uL   Basophils Relative 0 %   Basophils Absolute 0.1 0.0 - 0.1 K/uL   Immature Granulocytes 1 %   Abs Immature Granulocytes 0.12 (H) 0.00 - 0.07 K/uL    Comment: Performed at [facility], [address], [place], [place] [address]  Comprehensive metabolic panel     Status: Abnormal   Collection Time: 04/25/21  6:44 PM  Result  Value Ref Range   Sodium 125 (L) 135 - 145 mmol/L   Potassium 2.8 (L) 3.5 - 5.1 mmol/L   Chloride 93 (L) 98 - 111 mmol/L   CO2 23 22 - 32 mmol/L   Glucose, Bld 132 (H) 70 - 99 mg/dL    Comment: Glucose reference range applies only to samples taken after fasting for at least 8 hours.   BUN 11 8 - 23 mg/dL   Creatinine, Ser 9.93 0.44 - 1.00 mg/dL   Calcium 7.7 (L) 8.9 - 10.3 mg/dL   Total Protein 5.6 (L) 6.5 - 8.1 g/dL   Albumin 2.1 (L) 3.5 - 5.0 g/dL   AST 32 15 - 41 U/L   ALT 12 0 - 44 U/L   Alkaline Phosphatase 87 38 - 126 U/L   Total Bilirubin 1.1 0.3 - 1.2 mg/dL   GFR, Estimated >71 >69 mL/min    Comment: (NOTE) Calculated using the CKD-EPI Creatinine Equation (2021)    Anion gap 9 5 - 15    Comment: Performed at [facility], [address], [place], [place]  [address]  Magnesium     Status: None   Collection Time: 04/25/21  6:44 PM  Result Value Ref Range   Magnesium 1.9 1.7 - 2.4 mg/dL    Comment: Performed at [facility], [address] [place], [place] [address]  Phosphorus     Status: None   Collection Time: 04/25/21  6:44 PM  Result Value Ref Range   Phosphorus 3.1 2.5 - 4.6 mg/dL    Comment: Performed at [facility], [address] [place], [place] [address]  TSH     Status: None   Collection Time: 04/25/21  6:44 PM  Result Value Ref Range   TSH 2.918 0.350 - 4.500 uIU/mL    Comment: Performed by a 3rd Generation assay with a functional sensitivity of <=0.01 uIU/mL. Performed at [facility], [address] [place], [place] [address]   ABO/Rh     Status: None   Collection Time: 04/25/21  6:44 PM  Result Value Ref Range   ABO/RH(D)      O POS Performed at [facility], [address] [place], [place] [address]   Ethanol     Status: None   Collection Time: 04/25/21  7:07 PM  Result Value Ref Range   Alcohol, Ethyl (B) <10 <10 mg/dL    Comment: (NOTE) Lowest detectable limit for serum alcohol is 10 mg/dL.  For medical purposes only. Performed at [facility], [address] [place], [place] [address]    DG Chest Portable 1 View  Result Date: 04/25/2021 CLINICAL DATA:  Pt states weakness x few days. Denies vomiting, diarrhea, fever. Pt denies pain. Speech clear. Moving all extremities. Pt states eyes blurry bilaterally. HR 200 in triage. EXAM: PORTABLE CHEST 1 VIEW COMPARISON:  04/19/2018 FINDINGS: Cardiac silhouette is normal in size. No mediastinal or hilar masses. Clear lungs.  No pleural effusion or pneumothorax. Skeletal structures are grossly intact. IMPRESSION: No active disease. Electronically Signed   By: Amie Portland M.D.   On: 04/25/2021 19:30    Review of Systems  Constitutional:  Positive for activity change and appetite change.  HENT: Negative.    Eyes:  Negative.   Respiratory: Negative.    Cardiovascular: Negative.   Gastrointestinal: Negative.  Negative for abdominal pain, anal bleeding and blood in stool.  Endocrine: Negative.   Genitourinary: Negative.   Musculoskeletal: Negative.   Skin:  Positive for pallor.  Neurological:  Negative for seizures, facial asymmetry and numbness.  Hematological: Negative.   Psychiatric/Behavioral: Negative.     Blood pressure (!) 86/70, pulse 77, temperature (!) 97.5 F (36.4 C), temperature source Axillary, resp. rate 11, SpO2 100 %. Physical Exam Constitutional:      Appearance: She is ill-appearing.  HENT:     Head: Normocephalic and atraumatic.     Nose: Nose normal.  Cardiovascular:     Rate and Rhythm: Normal rate and regular rhythm.  Pulmonary:     Effort: Pulmonary effort is normal.     Breath sounds: Normal breath sounds.  Abdominal:     General: Bowel sounds are normal.     Palpations: Abdomen is soft.  Skin:    General: Skin is dry.     Capillary Refill: Capillary refill takes more than 3 seconds.     Coloration: Skin is pale.  Neurological:     General: No focal deficit present.     Mental Status: She is oriented to person, place, and time.  Psychiatric:        Mood and Affect: Mood normal.        Behavior: Behavior normal.     Assessment/Plan  SVT : Unstable and present on admission.  Patient spontaneously converted to sinus rhythm.  She did not require any medications or cardioversion.  Known history of SVTs in the past and was supposed to be on metoprolol.  Patient has been noncompliant with medications and medical follow-up.  Hypotension: Multifactorial etiology, likely secondary to arrhythmia, dehydration and anemia.  Blood pressure improved with IV fluids.  Blood pressure remains labile but patient is asymptomatic at this time.  No indication for pressors at this time.  We will send for TSH.  Patient will be admitted to stepdown unit for close monitoring.  Acute on  chronic anemia: Symptomatic anemia.Suspect GI blood loss: Known history of hemorrhoids.  Patient denies any recent melena like stools or bright red bleeding per rectum.  Patient will be typed and screened against 2 units of PRBC.  GI consult will be sought to evaluate for EGD and colonoscopy.  PPIs were also initiated  Acute hyponatremia: Most likely due to dehydration, beer potomania/poor solute intake.  Urine electrolytes and OSM pending. Patient will be volume repleted with IV normal saline.  Continue to monitor serum sodium on current regimen.  Acute hypokalemia: Most likely due to poor oral intake/solute intake.  Replete serum potassium as appropriate.Follow up magnesium and potassium in a.m  Alcohol abuse: Patient drinks 2-3 bottles of 40 ounces of Miller beer.  Patient will be placed on CIWA protocol.  Thiamine and folate replacement as appropriate.  Protein Calorie Malnutrition: Likely due to poor dietary habits and alcohol dependence.Nutritionist eval and management advised.   Tobacco Dependence:  Nicotine replacement therapy offered but declined.counselling was offered.  Patient remains critically ill.  Lilia Pro, MD 04/25/2021, 10:05 PM

## 2021-04-25 NOTE — ED Provider Notes (Signed)
[facility] Emergency Department Provider Note  ____________________________________________   Event Date/Time   First MD Initiated Contact with Patient 04/25/21 1854     (approximate)  I have reviewed the triage vital signs and the nursing notes.   HISTORY  Chief Complaint Weakness    HPI Peggy Brown is a 65 y.o. female  here with generalized weakness.  The patient arrives with complaint of generalized weakness.  She states that over the last 24 hours, she has felt generally, significantly fatigued.  She has had poor appetite.  She has not been eating and drinking.  She states that her sx acutely worsened this afternoon so she presents for evaluation. In triage, pt intermittently less responsive with HR 200. Brought immediately to room.  Reports she feels tired, lightheaded, SOB. No CP. No abd pain.       No past medical history on file.  Patient Active Problem List   Diagnosis Date Noted   Protein-calorie malnutrition, severe 04/21/2018   Hyponatremia 04/19/2018    Past Surgical History:  Procedure Laterality Date   CESAREAN SECTION      Prior to Admission medications   Medication Sig Start Date End Date Taking? Authorizing Provider  ibuprofen (ADVIL) 200 MG tablet Take 200-600 mg by mouth every 6 (six) hours as needed for fever or mild pain.    [provider]  potassium chloride (KLOR-CON) 10 MEQ tablet Take 1 tablet (10 mEq total) by mouth 2 (two) times daily for 7 days. 10/21/19 10/28/19  Dionne Bucy, MD    Allergies Patient has no known allergies.  Family History  Problem Relation Age of Onset   Hypertension Mother    Thyroid cancer Mother    COPD Father     Social History Social History   Tobacco Use   Smoking status: Every Day    Packs/day: 0.50    Years: 45.00    Pack years: 22.50    Types: Cigarettes   Smokeless tobacco: Never  Substance Use Topics   Alcohol use: Yes    Alcohol/week: 2.0  standard drinks    Types: 2 Cans of beer per week   Drug use: Never    Review of Systems  Review of Systems  Constitutional:  Positive for fatigue. Negative for chills and fever.  HENT:  Negative for sore throat.   Respiratory:  Negative for shortness of breath.   Cardiovascular:  Positive for palpitations. Negative for chest pain.  Gastrointestinal:  Negative for abdominal pain.  Genitourinary:  Negative for flank pain.  Musculoskeletal:  Negative for neck pain.  Skin:  Negative for rash and wound.  Allergic/Immunologic: Negative for immunocompromised state.  Neurological:  Positive for weakness. Negative for numbness.  Hematological:  Does not bruise/bleed easily.  All other systems reviewed and are negative.   ____________________________________________  PHYSICAL EXAM:      VITAL SIGNS: ED Triage Vitals  Enc Vitals Group     BP 04/25/21 1841 (!) 66/51     Pulse Rate 04/25/21 1841 (!) 202     Resp 04/25/21 1841 14     Temp 04/25/21 1900 (!) 97.5 F (36.4 C)     Temp Source 04/25/21 1900 Axillary     SpO2 04/25/21 1841 100 %     Weight --      Height --      Head Circumference --      Peak Flow --      Pain Score 04/25/21 1844 0  Pain Loc --      Pain Edu? --      Excl. in GC? --      Physical Exam Vitals and nursing note reviewed.  Constitutional:      General: She is not in acute distress.    Appearance: She is well-developed.  HENT:     Head: Normocephalic and atraumatic.     Mouth/Throat:     Mouth: Mucous membranes are dry.  Eyes:     Conjunctiva/sclera: Conjunctivae normal.  Cardiovascular:     Rate and Rhythm: Regular rhythm. Tachycardia present.     Heart sounds: Normal heart sounds.  Pulmonary:     Effort: Pulmonary effort is normal. No respiratory distress.     Breath sounds: No wheezing.  Abdominal:     General: There is no distension.  Musculoskeletal:     Cervical back: Neck supple.  Skin:    General: Skin is warm.     Capillary  Refill: Capillary refill takes less than 2 seconds.     Findings: No rash.  Neurological:     Mental Status: She is alert and oriented to person, place, and time.     Motor: No abnormal muscle tone.      ____________________________________________   LABS (all labs ordered are listed, but only abnormal results are displayed)  Labs Reviewed  CBC WITH DIFFERENTIAL/PLATELET - Abnormal; Notable for the following components:      Result Value   WBC 14.7 (*)    RBC 1.99 (*)    Hemoglobin 6.8 (*)    HCT 18.2 (*)    MCH 34.2 (*)    MCHC 37.4 (*)    Platelets 137 (*)    nRBC 1.6 (*)    Neutro Abs 11.0 (*)    Abs Immature Granulocytes 0.12 (*)    All other components within normal limits  COMPREHENSIVE METABOLIC PANEL - Abnormal; Notable for the following components:   Sodium 125 (*)    Potassium 2.8 (*)    Chloride 93 (*)    Glucose, Bld 132 (*)    Calcium 7.7 (*)    Total Protein 5.6 (*)    Albumin 2.1 (*)    All other components within normal limits  RESP PANEL BY RT-PCR (FLU A&B, COVID) ARPGX2  MAGNESIUM  PHOSPHORUS  TSH  ETHANOL  VITAMIN B12  FOLATE  IRON AND TIBC  FERRITIN  RETICULOCYTES  OCCULT BLOOD X 1 CARD TO LAB, STOOL  TYPE AND SCREEN  PREPARE RBC (CROSSMATCH)    ____________________________________________  EKG: Supraventricular tachycardia, ventricular rate 200.  QRS 162, QTc 394.  Diffuse ST changes, likely demand ischemia/rate related.  EKG: Normal sinus rhythm, ventricular rate 94.  QRS 96, QTc 469.  No acute ST elevations or depressions. ________________________________________  RADIOLOGY All imaging, including plain films, CT scans, and ultrasounds, independently reviewed by me, and interpretations confirmed via formal radiology reads.  ED MD interpretation:   Chest x-ray: No active disease  Official radiology report(s): DG Chest Portable 1 View  Result Date: 04/25/2021 CLINICAL DATA:  Pt states weakness x few days. Denies vomiting,  diarrhea, fever. Pt denies pain. Speech clear. Moving all extremities. Pt states eyes blurry bilaterally. HR 200 in triage. EXAM: PORTABLE CHEST 1 VIEW COMPARISON:  04/19/2018 FINDINGS: Cardiac silhouette is normal in size. No mediastinal or hilar masses. Clear lungs.  No pleural effusion or pneumothorax. Skeletal structures are grossly intact. IMPRESSION: No active disease. Electronically Signed   By: Renard Hamper.D.  On: 04/25/2021 19:30    ____________________________________________  PROCEDURES   Procedure(s) performed (including Critical Care):  .Critical Care  Date/Time: 04/25/2021 8:53 PM Performed by: Shaune Pollack, MD Authorized by: Shaune Pollack, MD   Critical care provider statement:    Critical care time (minutes):  35   Critical care time was exclusive of:  Separately billable procedures and treating other patients and teaching time   Critical care was necessary to treat or prevent imminent or life-threatening deterioration of the following conditions:  Circulatory failure, cardiac failure and respiratory failure   Critical care was time spent personally by me on the following activities:  Development of treatment plan with patient or surrogate, discussions with consultants, evaluation of patient's response to treatment, examination of patient, obtaining history from patient or surrogate, ordering and performing treatments and interventions, ordering and review of laboratory studies, ordering and review of radiographic studies, pulse oximetry, re-evaluation of patient's condition and review of old charts   I assumed direction of critical care for this patient from another provider in my specialty: no    ____________________________________________  INITIAL IMPRESSION / MDM / ASSESSMENT AND PLAN / ED COURSE  As part of my medical decision making, I reviewed the following data within the electronic MEDICAL RECORD NUMBER Nursing notes reviewed and incorporated, Old chart  reviewed, Notes from prior ED visits, and [place] Controlled Substance Database       *Peggy Brown was evaluated in Emergency Department on 04/25/2021 for the symptoms described in the history of present illness. She was evaluated in the context of the global COVID-19 pandemic, which necessitated consideration that the patient might be at risk for infection with the SARS-CoV-2 virus that causes COVID-19. Institutional protocols and algorithms that pertain to the evaluation of patients at risk for COVID-19 are in a state of rapid change based on information released by regulatory bodies including the CDC and federal and state organizations. These policies and algorithms were followed during the patient's care in the ED.  Some ED evaluations and interventions may be delayed as a result of limited staffing during the pandemic.*     Medical Decision Making: 65 year old female with past medical history as above here with generalized weakness.  On arrival, patient noted to be in SVT, with hypotension.  She was given 2 L of normal saline, and placed in Trendelenburg with carotid massage.  She spontaneously cardioverted to normal sinus rhythm.  Lab work shows hyponatremia and hypokalemia, which I suspect is hypovolemic in the setting of poor p.o. intake.  Hemoglobin is also 6.8.  She has a history of chronic alcohol use.  Suspect slow GI bleed.  No signs of active bleeding.  Will send iron panel, Hemoccult, admit to medicine.  ____________________________________________  FINAL CLINICAL IMPRESSION(S) / ED DIAGNOSES  Final diagnoses:  SVT (supraventricular tachycardia) (HCC)  Hypokalemia     MEDICATIONS GIVEN DURING THIS VISIT:  Medications  0.9 %  sodium chloride infusion (has no administration in time range)  potassium chloride 10 mEq in 100 mL IVPB (has no administration in time range)  potassium chloride SA (KLOR-CON) CR tablet 40 mEq (has no administration in time range)  thiamine (B-1)  injection 100 mg (has no administration in time range)  sodium chloride 0.9 % bolus 1,000 mL (1,000 mLs Intravenous New Bag/Given 04/25/21 1852)  sodium chloride 0.9 % bolus 1,000 mL (1,000 mLs Intravenous New Bag/Given 04/25/21 1853)     ED Discharge Orders     None  Note:  This document was prepared using Dragon voice recognition software and may include unintentional dictation errors.   Shaune PollackIsaacs, Kelechi Astarita, MD 04/25/21 [phone]

## 2021-04-25 NOTE — ED Notes (Signed)
Took over care of pt, alert and orientedx4 resting, hr 92. No complaints at this time EKG performed

## 2021-04-25 NOTE — ED Triage Notes (Signed)
Pt states weakness x few days. Denies vomiting, diarrhea, fever. Pt denies pain. Speech clear. Moving all extremities. Pt states eyes blurry bilaterally. HR 200 in triage. EKG performed and Dr. Erma Heritage called to triage. Consulting civil engineer notified.

## 2021-04-25 NOTE — ED Notes (Signed)
Patient to room. Issacs,MD at bedside

## 2021-04-25 NOTE — ED Notes (Signed)
Fluids started.  Issacs, MD applied carotid pressure. Pt's HR now 98.

## 2021-04-26 ENCOUNTER — Inpatient Hospital Stay: Payer: Medicare Other

## 2021-04-26 DIAGNOSIS — E876 Hypokalemia: Secondary | ICD-10-CM | POA: Diagnosis present

## 2021-04-26 DIAGNOSIS — D62 Acute posthemorrhagic anemia: Secondary | ICD-10-CM | POA: Diagnosis not present

## 2021-04-26 DIAGNOSIS — I471 Supraventricular tachycardia: Secondary | ICD-10-CM | POA: Diagnosis not present

## 2021-04-26 DIAGNOSIS — F1010 Alcohol abuse, uncomplicated: Secondary | ICD-10-CM | POA: Insufficient documentation

## 2021-04-26 DIAGNOSIS — I959 Hypotension, unspecified: Secondary | ICD-10-CM | POA: Insufficient documentation

## 2021-04-26 LAB — COMPREHENSIVE METABOLIC PANEL
ALT: 10 U/L (ref 0–44)
ALT: 14 U/L (ref 0–44)
AST: 19 U/L (ref 15–41)
AST: 27 U/L (ref 15–41)
Albumin: 1.7 g/dL — ABNORMAL LOW (ref 3.5–5.0)
Albumin: 1.9 g/dL — ABNORMAL LOW (ref 3.5–5.0)
Alkaline Phosphatase: 69 U/L (ref 38–126)
Alkaline Phosphatase: 70 U/L (ref 38–126)
Anion gap: 4 — ABNORMAL LOW (ref 5–15)
Anion gap: 5 (ref 5–15)
BUN: 9 mg/dL (ref 8–23)
BUN: 9 mg/dL (ref 8–23)
CO2: 22 mmol/L (ref 22–32)
CO2: 21 mmol/L — ABNORMAL LOW (ref 22–32)
Calcium: 6.7 mg/dL — ABNORMAL LOW (ref 8.9–10.3)
Calcium: 7 mg/dL — ABNORMAL LOW (ref 8.9–10.3)
Chloride: 103 mmol/L (ref 98–111)
Chloride: 104 mmol/L (ref 98–111)
Creatinine, Ser: 0.47 mg/dL (ref 0.44–1.00)
Creatinine, Ser: 0.59 mg/dL (ref 0.44–1.00)
GFR, Estimated: >60 mL/min (ref >60)
GFR, Estimated: >60 mL/min (ref >60)
Glucose, Bld: 101 mg/dL — ABNORMAL HIGH (ref 70–99)
Glucose, Bld: 76 mg/dL (ref 70–99)
Potassium: 3.1 mmol/L — ABNORMAL LOW (ref 3.5–5.1)
Potassium: 3.7 mmol/L (ref 3.5–5.1)
Sodium: 129 mmol/L — ABNORMAL LOW (ref 135–145)
Sodium: 130 mmol/L — ABNORMAL LOW (ref 135–145)
Total Bilirubin: 0.9 mg/dL (ref 0.3–1.2)
Total Bilirubin: 2.1 mg/dL — ABNORMAL HIGH (ref 0.3–1.2)
Total Protein: 4.3 g/dL — ABNORMAL LOW (ref 6.5–8.1)
Total Protein: 4.9 g/dL — ABNORMAL LOW (ref 6.5–8.1)

## 2021-04-26 LAB — CBC
HCT: 14.4 % — CL (ref 36.0–46.0)
HCT: 33 % — ABNORMAL LOW (ref 36.0–46.0)
Hemoglobin: 5.4 g/dL — ABNORMAL LOW (ref 12.0–15.0)
Hemoglobin: 11.7 g/dL — ABNORMAL LOW (ref 12.0–15.0)
MCH: 34.8 pg — ABNORMAL HIGH (ref 26.0–34.0)
MCH: 32.1 pg (ref 26.0–34.0)
MCHC: 37.5 g/dL — ABNORMAL HIGH (ref 30.0–36.0)
MCHC: 35.5 g/dL (ref 30.0–36.0)
MCV: 92.9 fL (ref 80.0–100.0)
MCV: 90.7 fL (ref 80.0–100.0)
Platelets: 81 10*3/uL — ABNORMAL LOW (ref 150–400)
Platelets: 78 10*3/uL — ABNORMAL LOW (ref 150–400)
RBC: 1.55 MIL/uL — ABNORMAL LOW (ref 3.87–5.11)
RBC: 3.64 MIL/uL — ABNORMAL LOW (ref 3.87–5.11)
RDW: 15.6 % — ABNORMAL HIGH (ref 11.5–15.5)
RDW: 14.5 % (ref 11.5–15.5)
WBC: 10.4 10*3/uL (ref 4.0–10.5)
WBC: 10 10*3/uL (ref 4.0–10.5)
nRBC: 1 % — ABNORMAL HIGH (ref 0.0–0.2)
nRBC: 1.5 % — ABNORMAL HIGH (ref 0.0–0.2)

## 2021-04-26 LAB — HEMOGLOBIN
Hemoglobin: 11.7 g/dL — ABNORMAL LOW (ref 12.0–15.0)
Hemoglobin: 11.5 g/dL — ABNORMAL LOW (ref 12.0–15.0)

## 2021-04-26 LAB — HIV ANTIBODY (ROUTINE TESTING W REFLEX): HIV Screen 4th Generation wRfx: NONREACTIVE

## 2021-04-26 LAB — PREPARE RBC (CROSSMATCH)

## 2021-04-26 LAB — MAGNESIUM
Magnesium: 1.6 mg/dL — ABNORMAL LOW (ref 1.7–2.4)
Magnesium: 2.3 mg/dL (ref 1.7–2.4)

## 2021-04-26 LAB — PROTIME-INR
INR: 1.1 (ref 0.8–1.2)
Prothrombin Time: 14.1 seconds (ref 11.4–15.2)

## 2021-04-26 LAB — RESP PANEL BY RT-PCR (FLU A&B, COVID) ARPGX2
Influenza A by PCR: NEGATIVE
Influenza B by PCR: NEGATIVE
SARS Coronavirus 2 by RT PCR: NEGATIVE

## 2021-04-26 LAB — VITAMIN B12: Vitamin B-12: 967 pg/mL — ABNORMAL HIGH (ref 180–914)

## 2021-04-26 LAB — PHOSPHORUS: Phosphorus: 2.8 mg/dL (ref 2.5–4.6)

## 2021-04-26 LAB — TSH: TSH: 2.388 u[IU]/mL (ref 0.350–4.500)

## 2021-04-26 MED ORDER — ALBUTEROL SULFATE (2.5 MG/3ML) 0.083% IN NEBU: 2.5000 mg | INHALATION_SOLUTION | RESPIRATORY_TRACT | Status: DC | PRN

## 2021-04-26 MED ORDER — POTASSIUM CHLORIDE IN NACL 20-0.9 MEQ/L-% IV SOLN
INTRAVENOUS | Status: DC
  Filled 2021-04-26 (×4): qty 1000

## 2021-04-26 MED ORDER — ONDANSETRON HCL 4 MG/2ML IJ SOLN
4.0000 mg | Freq: Four times a day (QID) | INTRAMUSCULAR | Status: DC | PRN
  Administered 2021-04-27 (×2): 4 mg via INTRAVENOUS
  Filled 2021-04-26 (×2): qty 2

## 2021-04-26 MED ORDER — POTASSIUM CHLORIDE CRYS ER 20 MEQ PO TBCR
40.0000 meq | EXTENDED_RELEASE_TABLET | Freq: Once | ORAL | Status: AC
  Administered 2021-04-26: 40 meq via ORAL
  Filled 2021-04-26: qty 2

## 2021-04-26 MED ORDER — ONDANSETRON HCL 4 MG PO TABS: 4.0000 mg | ORAL_TABLET | Freq: Four times a day (QID) | ORAL | Status: DC | PRN

## 2021-04-26 MED ORDER — ACETAMINOPHEN 325 MG PO TABS: 650.0000 mg | ORAL_TABLET | Freq: Four times a day (QID) | ORAL | Status: DC | PRN

## 2021-04-26 MED ORDER — THIAMINE HCL 100 MG/ML IJ SOLN
Freq: Once | INTRAVENOUS | Status: AC
  Filled 2021-04-26: qty 1000

## 2021-04-26 MED ORDER — ACETAMINOPHEN 650 MG RE SUPP: 650.0000 mg | Freq: Four times a day (QID) | RECTAL | Status: DC | PRN

## 2021-04-26 MED ORDER — SODIUM CHLORIDE 0.9% IV SOLUTION
Freq: Once | INTRAVENOUS | Status: AC
  Administered 2021-04-27: 20 mL/h via INTRAVENOUS
  Filled 2021-04-26: qty 250

## 2021-04-26 MED ORDER — SENNOSIDES-DOCUSATE SODIUM 8.6-50 MG PO TABS: 1.0000 | ORAL_TABLET | Freq: Every evening | ORAL | Status: DC | PRN

## 2021-04-26 MED ORDER — MAGNESIUM SULFATE 2 GM/50ML IV SOLN
2.0000 g | Freq: Once | INTRAVENOUS | Status: AC
  Administered 2021-04-26: 2 g via INTRAVENOUS
  Filled 2021-04-26: qty 50

## 2021-04-26 MED ORDER — NICOTINE 14 MG/24HR TD PT24
14.0000 mg | MEDICATED_PATCH | Freq: Every day | TRANSDERMAL | Status: DC
  Administered 2021-04-27 – 2021-04-30 (×3): 14 mg via TRANSDERMAL
  Filled 2021-04-26 (×5): qty 1

## 2021-04-26 MED ORDER — PEG 3350-KCL-NA BICARB-NACL 420 G PO SOLR
4000.0000 mL | Freq: Once | ORAL | Status: AC
  Administered 2021-04-26: 4000 mL via ORAL
  Filled 2021-04-26: qty 4000

## 2021-04-26 MED ORDER — FOLIC ACID 1 MG PO TABS: 1.0000 mg | ORAL_TABLET | Freq: Every day | ORAL | Status: DC

## 2021-04-26 NOTE — ED Notes (Signed)
Daughter approached nurses station demanding to know why the TV was shut off in pt's room and "when the last time my mom was checked on". Daughter was educated on that this RN has been in there frequently regarding beeping IV bump and the need for pt to void.

## 2021-04-26 NOTE — ED Notes (Signed)
Lab at bedside

## 2021-04-26 NOTE — ED Notes (Signed)
Expired hemoccult card sent to lab by night shift, RN, labn called thi RN and stated they cant run the card due to the card being expired, this RN went to obtain card and let Dr. Renae Gloss know that card was expired and that I obtained the card if he wanted to put the developer on it.

## 2021-04-26 NOTE — Consult Note (Signed)
Consultation  Referring Provider:     Dr Renae Gloss Admit date 04/25/21 Consult date        02/24/21 Reason for Consultation:     Symptomatic anemia         HPI:   Peggy Brown is a 65 y.o. female history of chronic etoh abuse, SVt admitted with weakness,  SVT, hypotension,  symptomatic anemia- hgn 5.4 & received transfusion/fluids, history of etoh abuse. Endorses80-120oz/beer/d up until a week ago. Started drinking heavily in her late 51s. Started on CIWA, thiamine, and folate. Has been started on bid IV pantoprazole 40mg  PT/INR normal. Hgb todaty 11.7, t bili 2.1 & transaminases/alp normal. Albumin 1.9. Na up to130, K+ normal. Kidneys normal. Ferritin elevated and iron normal. Iron saturation elevated Patient reports other than feeling a little cold, she is ok. States she has seen some black stools on and off for awhile. Denies any rectal bleeding. Denies any abdominal pain, NVD, bowel habit changes, gerd/dysphagia, further GI concerns. States she has not had any liver related symptoms like jaundice/ascites/bleeding. Denies any vaginal bleeding/related complaints.  States both parents and other family members with PUD. Denies any family history of colorectal cancer/liver disease/colon polyps.  Denies NSAIDS and weight loss.   PREVIOUS ENDOSCOPIES:            Reports a colonoscopy many years ago   No past medical history on file. Has not been following with HCPs regularly  Past Surgical History:  Procedure Laterality Date   CESAREAN SECTION      Family History  Problem Relation Age of Onset   Hypertension Mother    Thyroid cancer Mother    COPD Father      Social History   Tobacco Use   Smoking status: Every Day    Packs/day: 0.50    Years: 45.00    Pack years: 22.50    Types: Cigarettes   Smokeless tobacco: Never  Substance Use Topics   Alcohol use: Yes    Alcohol/week: 2.0 standard drinks    Types: 2 Cans of beer per week   Drug use: Never    Prior to Admission  medications   Medication Sig Start Date End Date Taking? Authorizing Provider  acetaminophen (TYLENOL) 500 MG tablet Take 500-1,000 mg by mouth every 8 (eight) hours as needed for moderate pain.   Yes [provider]  ibuprofen (ADVIL) 200 MG tablet Take 200-600 mg by mouth every 6 (six) hours as needed for fever or mild pain.   Yes [provider]    Current Facility-Administered Medications  Medication Dose Route Frequency Provider Last Rate Last Admin   0.9 %  sodium chloride infusion (Manually program via Guardrails IV Fluids)   Intravenous Once Acheampong, , MD       0.9 % NaCl with KCl 20 mEq/ L  infusion   Intravenous Continuous Genice Rouge, MD 40 mL/hr at 04/26/21 0843 New Bag at 04/26/21 0843   acetaminophen (TYLENOL) tablet 650 mg  650 mg Oral Q6H PRN Acheampong, 04/28/21, MD       Or   acetaminophen (TYLENOL) suppository 650 mg  650 mg Rectal Q6H PRN Acheampong, Genice Rouge, MD       albuterol (PROVENTIL) (2.5 MG/3ML) 0.083% nebulizer solution 2.5 mg  2.5 mg Nebulization Q2H PRN Acheampong, Genice Rouge, MD       folic acid (FOLVITE) tablet 1 mg  1 mg Oral Daily Acheampong, Genice Rouge, MD   1 mg at 04/26/21 1145   LORazepam (  ATIVAN) tablet 1-4 mg  1-4 mg Oral Q1H PRN Acheampong, Genice Rouge, MD       Or   LORazepam (ATIVAN) injection 1-4 mg  1-4 mg Intravenous Q1H PRN Acheampong, Genice Rouge, MD       multivitamin with minerals tablet 1 tablet  1 tablet Oral Daily Acheampong, Genice Rouge, MD   1 tablet at 04/26/21 1145   nicotine (NICODERM CQ - dosed in mg/24 hours) patch 14 mg  14 mg Transdermal Daily Acheampong, Genice Rouge, MD       ondansetron [facility]) tablet 4 mg  4 mg Oral Q6H PRN Acheampong, Genice Rouge, MD       Or   ondansetron (ZOFRAN) injection 4 mg  4 mg Intravenous Q6H PRN Acheampong, Genice Rouge, MD       pantoprazole (PROTONIX) injection 40 mg  40 mg Intravenous Q12H Lilia Pro, MD   40 mg at 04/26/21 1145   senna-docusate (Senokot-S) tablet 1 tablet  1 tablet Oral  QHS PRN Acheampong, Genice Rouge, MD       thiamine tablet 100 mg  100 mg Oral Daily Acheampong, Genice Rouge, MD   100 mg at 04/26/21 1145   Or   thiamine (B-1) injection 100 mg  100 mg Intravenous Daily Acheampong, Genice Rouge, MD       Current Outpatient Medications  Medication Sig Dispense Refill   acetaminophen (TYLENOL) 500 MG tablet Take 500-1,000 mg by mouth every 8 (eight) hours as needed for moderate pain.     ibuprofen (ADVIL) 200 MG tablet Take 200-600 mg by mouth every 6 (six) hours as needed for fever or mild pain.      Allergies as of 04/25/2021   (No Known Allergies)     Review of Systems:    All systems reviewed and negative except where noted in HPI.     Physical Exam:  Vital signs in last 24 hours: Temp:  [97.5 F (36.4 C)-98.2 F (36.8 C)] 97.9 F (36.6 C) (07/25 0930) Pulse Rate:  [38-202] 73 (07/25 1200) Resp:  [9-21] 19 (07/25 1200) BP: (66-129)/(43-94) 120/77 (07/25 1200) SpO2:  [96 %-100 %] 100 % (07/25 1200)   General:   Pleasant woman in NAD. She is cachectic. Head:  Normocephalic and atraumatic. Eyes:   No icterus.   Conjunctiva pink. Ears:  Normal auditory acuity. Mouth: Mucosa pink moist, no lesions. Neck:  Supple; no masses felt Lungs:  Respirations even and unlabored. Lungs clear to auscultation bilaterally.   No wheezes, crackles, or rhonchi.  Heart:  S1S2, RRR, no MRG. No edema. Abdomen:   Flat, soft, nondistended, nontender. Normal bowel sounds. No appreciable masses or hepatomegaly. No rebound signs or other peritoneal signs. Rectal:  Not performed.  Msk:  MAEW x4, No clubbing or cyanosis. Strength 4/5. Symmetrical without gross deformities. Neurologic:  Alert and  oriented x4;  Cranial nerves II-XII intact.  Skin:  Warm, dry, pink without significant lesions or rashes. Psych:  Alert and cooperative. Normal affect. Limited judgement/insight  LAB RESULTS: Recent Labs    04/25/21 1844 04/25/21 2317 04/26/21 0812 04/26/21 1020  WBC 14.7* 10.4  10.0  --   HGB 6.8* 5.4* 11.7* 11.7*  HCT 18.2* 14.4* 33.0*  --   PLT 137* 81* 78*  --    BMET Recent Labs    04/25/21 1844 04/25/21 2317 04/26/21 0812  NA 125* 129*  128* 130*  K 2.8* 3.1* 3.7  CL 93* 103 104  CO2 23 22 21*  GLUCOSE  132* 101* 76  BUN 11 9 9   CREATININE 0.63 0.47 0.59  CALCIUM 7.7* 6.7* 7.0*   LFT Recent Labs    04/26/21 0812  PROT 4.9*  ALBUMIN 1.9*  AST 27  ALT 14  ALKPHOS 70  BILITOT 2.1*   PT/INR Recent Labs    04/26/21 0812  LABPROT 14.1  INR 1.1    STUDIES: DG Chest Portable 1 View  Result Date: 04/25/2021 CLINICAL DATA:  Pt states weakness x few days. Denies vomiting, diarrhea, fever. Pt denies pain. Speech clear. Moving all extremities. Pt states eyes blurry bilaterally. HR 200 in triage. EXAM: PORTABLE CHEST 1 VIEW COMPARISON:  04/19/2018 FINDINGS: Cardiac silhouette is normal in size. No mediastinal or hilar masses. Clear lungs.  No pleural effusion or pneumothorax. Skeletal structures are grossly intact. IMPRESSION: No active disease. Electronically Signed   By: 04/21/2018 M.D.   On: 04/25/2021 19:30       Impression / Plan:   Anemia exacerbation, melena- did discuss egd and colonoscopy with her and she is agreeable. Has been NPO today. Can have clears for today- plan for egd/colonoscopy tomorrow as clinically feasible Alcohol abuse- elevated liver enzymes- recommend alcohol avoidance for now- will need o/p care to help with this. Pt/inr normal but platelets low- suspect she has some alcoholic cirrhosis- will order abdominal 04/27/2021- needs liver eval as o/p  Thank you very much for this consult. These services were provided by [place], NP-C, in collaboration with Vevelyn Pat MD, with whom I have discussed this patient in full.   Eather Colas, NP-C

## 2021-04-26 NOTE — ED Notes (Signed)
Pt resting comfortably at this time. NAD noted. Blood and IVF at this time. Pt denies any issues or reaction concerns at this time. VSS at this time. Family at bedside. Call bell in reach.

## 2021-04-26 NOTE — ED Notes (Signed)
Pt called out to nurses station stating she "needed to be cleaned up", pt had a small brown BM (incontinent) and voided which soaked the underlying chux. Pt cleaned up and new chux and new purewick applied to pt. Pt denies any further needs regarding TV or lights.   Warm blankets provided to pt as well.   Pt denies any further needs at this time. Call bell in reach.

## 2021-04-26 NOTE — ED Notes (Addendum)
Pt given ginger ale and also repositioned in bed.

## 2021-04-26 NOTE — Progress Notes (Signed)
Per patient  request, daughter Banesa Tristan was made aware that pt will go for colonoscopy and Esophagogastroduodenoscopy tomorrow. Pt is A&O x3. Per pt, she can sign her own consent but needs her daughter [name] to be aware. Daughter [name] confirmed that pt can sign her own consent. Consent obtained.

## 2021-04-26 NOTE — ED Notes (Signed)
US at bedside

## 2021-04-26 NOTE — Progress Notes (Signed)
Patient ID: Peggy Brown, female   DOB: 02-Jul-1956, 65 y.o.   MRN: [id number] [facility] PROGRESS NOTE  Peggy Brown VXB:[id number] DOB: [birth year] DOA: 04/25/2021 PCP: Patient, No Pcp Per (Inactive)  HPI/Subjective: Patient came in with weakness.  Patient was initially hypotensive and in SVT.  Patient was given fluid boluses.  Hemoglobin was as low as 5.4.  Received 2 units of blood and hemoglobin came up to 11.7.  No complaints of chest pain or shortness of breath.  Patient states that she has not been bleeding and no black stools.  No vomiting blood.  Objective: Vitals:   04/26/21 1145 04/26/21 1200  BP: 114/74 120/77  Pulse: 71 73  Resp: 13 19  Temp:    SpO2: 100% 100%    Intake/Output Summary (Last 24 hours) at 04/26/2021 1501 Last data filed at 04/26/2021 0459 Gross per 24 hour  Intake 1775.3 ml  Output --  Net 1775.3 ml   There were no vitals filed for this visit.  ROS: Review of Systems  Respiratory:  Negative for shortness of breath.   Cardiovascular:  Negative for chest pain.  Gastrointestinal:  Negative for abdominal pain, nausea and vomiting.  Exam: Physical Exam HENT:     Head: Normocephalic.     Mouth/Throat:     Pharynx: No oropharyngeal exudate.  Eyes:     General: Lids are normal.     Conjunctiva/sclera: Conjunctivae normal.     Pupils: Pupils are equal, round, and reactive to light.  Cardiovascular:     Rate and Rhythm: Normal rate and regular rhythm.     Heart sounds: Normal heart sounds, S1 normal and S2 normal.  Pulmonary:     Breath sounds: No decreased breath sounds, wheezing, rhonchi or rales.  Abdominal:     Palpations: Abdomen is soft.     Tenderness: There is no abdominal tenderness.  Musculoskeletal:     Right knee: No swelling.     Left knee: No swelling.  Skin:    General: Skin is warm.     Findings: No rash.  Neurological:     Mental Status: She is alert and oriented to person, place, and time.     Data  Reviewed: Basic Metabolic Panel: Recent Labs  Lab 04/25/21 1844 04/25/21 2317 04/26/21 0812  NA 125* 129*  128* 130*  K 2.8* 3.1* 3.7  CL 93* 103 104  CO2 23 22 21*  GLUCOSE 132* 101* 76  BUN 11 9 9   CREATININE 0.63 0.47 0.59  CALCIUM 7.7* 6.7* 7.0*  MG 1.9 1.6* 2.3  PHOS 3.1 2.8  --    Liver Function Tests: Recent Labs  Lab 04/25/21 1844 04/25/21 2317 04/26/21 0812  AST 32 19 27  ALT 12 10 14   ALKPHOS 87 69 70  BILITOT 1.1 0.9 2.1*  PROT 5.6* 4.3* 4.9*  ALBUMIN 2.1* 1.7* 1.9*   CBC: Recent Labs  Lab 04/25/21 1844 04/25/21 2317 04/26/21 0812 04/26/21 1020  WBC 14.7* 10.4 10.0  --   NEUTROABS 11.0*  --   --   --   HGB 6.8* 5.4* 11.7* 11.7*  HCT 18.2* 14.4* 33.0*  --   MCV 91.5 92.9 90.7  --   PLT 137* 81* 78*  --      Recent Results (from the past 240 hour(s))  Resp Panel by RT-PCR (Flu A&B, Covid) Nasopharyngeal Swab     Status: None   Collection Time: 04/25/21 11:17 PM   Specimen: Nasopharyngeal Swab; Nasopharyngeal(NP) swabs in vial transport  medium  Result Value Ref Range Status   SARS Coronavirus 2 by RT PCR NEGATIVE NEGATIVE Final    Comment: (NOTE) SARS-CoV-2 target nucleic acids are NOT DETECTED.  The SARS-CoV-2 RNA is generally detectable in upper respiratory specimens during the acute phase of infection. The lowest concentration of SARS-CoV-2 viral copies this assay can detect is 138 copies/mL. A negative result does not preclude SARS-Cov-2 infection and should not be used as the sole basis for treatment or other patient management decisions. A negative result may occur with  improper specimen collection/handling, submission of specimen other than nasopharyngeal swab, presence of viral mutation(s) within the areas targeted by this assay, and inadequate number of viral copies(<138 copies/mL). A negative result must be combined with clinical observations, patient history, and epidemiological information. The expected result is  Negative.  Fact Sheet for Patients:  BloggerCourse.com  Fact Sheet for Healthcare Providers:  SeriousBroker.it  This test is no t yet approved or cleared by the [place] FDA and  has been authorized for detection and/or diagnosis of SARS-CoV-2 by FDA under an Emergency Use Authorization (EUA). This EUA will remain  in effect (meaning this test can be used) for the duration of the COVID-19 declaration under Section 564(b)(1) of the Act, 21 U.S.C.section 360bbb-3(b)(1), unless the authorization is terminated  or revoked sooner.       Influenza A by PCR NEGATIVE NEGATIVE Final   Influenza B by PCR NEGATIVE NEGATIVE Final    Comment: (NOTE) The Xpert Xpress SARS-CoV-2/FLU/RSV plus assay is intended as an aid in the diagnosis of influenza from Nasopharyngeal swab specimens and should not be used as a sole basis for treatment. Nasal washings and aspirates are unacceptable for Xpert Xpress SARS-CoV-2/FLU/RSV testing.  Fact Sheet for Patients: BloggerCourse.com  Fact Sheet for Healthcare Providers: SeriousBroker.it  This test is not yet approved or cleared by the [place] FDA and has been authorized for detection and/or diagnosis of SARS-CoV-2 by FDA under an Emergency Use Authorization (EUA). This EUA will remain in effect (meaning this test can be used) for the duration of the COVID-19 declaration under Section 564(b)(1) of the Act, 21 U.S.C. section 360bbb-3(b)(1), unless the authorization is terminated or revoked.  Performed at [facility], [address], [place], [place] [address]      Studies: DG Chest Portable 1 View  Result Date: 04/25/2021 CLINICAL DATA:  Pt states weakness x few days. Denies vomiting, diarrhea, fever. Pt denies pain. Speech clear. Moving all extremities. Pt states eyes blurry bilaterally. HR 200 in triage. EXAM: PORTABLE CHEST 1  VIEW COMPARISON:  04/19/2018 FINDINGS: Cardiac silhouette is normal in size. No mediastinal or hilar masses. Clear lungs.  No pleural effusion or pneumothorax. Skeletal structures are grossly intact. IMPRESSION: No active disease. Electronically Signed   By: Amie Portland M.D.   On: 04/25/2021 19:30    Scheduled Meds:  sodium chloride   Intravenous Once   folic acid  1 mg Oral Daily   multivitamin with minerals  1 tablet Oral Daily   nicotine  14 mg Transdermal Daily   pantoprazole (PROTONIX) IV  40 mg Intravenous Q12H   thiamine  100 mg Oral Daily   Or   thiamine  100 mg Intravenous Daily   Continuous Infusions:  0.9 % NaCl with KCl 20 mEq / L 40 mL/hr at 04/26/21 0843    Assessment/Plan:  Acute on chronic blood loss anemia.  Patient's hemoglobin was as low as 5.4.  Was given 2 units of packed  red blood cells as hemoglobin came up to 11.7.  Iron studies consistent with anemia of chronic disease.  Await GI evaluation.  May end up needing a CT scan.  No nausea vomiting and does [place] black tar-like stools or bright red blood per rectum.  Empiric Protonix. SVT and hypotension.  Patient's heart rate is now normal.  Blood pressure is also come up a little bit with treatment. Alcohol abuse.  No signs of withdrawal.  On thiamine multivitamin and folic acid Hyponatremia likely secondary to alcohol. Hypokalemia replace potassium Hypomagnesemia.  Potassium replaced. Severe protein calorie malnutrition        Code Status:     Code Status Orders  (From admission, onward)           Start     Ordered   04/26/21 0738  Full code  Continuous        04/26/21 0737           Code Status History     Date Active Date Inactive Code Status Order ID Comments User Context   04/19/2018 1846 04/21/2018 2032 Full Code [id number]  Houston Siren, MD Inpatient      Family Communication: Spoke with family on the phone Disposition Plan: Status is: Inpatient  Dispo: The patient is from:  Home              Anticipated d/c is to: Home              Patient currently received 2 units of blood with good response.  Awaiting GI evaluation.  We will get PT evaluation.   Difficult to place patient.  No  Consultants: Gastroenterology  Time spent: 28 minutes  Peggy Brown

## 2021-04-26 NOTE — ED Notes (Signed)
Pt resting complaining of being cold and blanket given

## 2021-04-26 NOTE — ED Notes (Signed)
Dr. Renae Gloss notified of Hgb and K+ new lab results, MD states to not give any more blood at this time until Hgb is clarified.

## 2021-04-26 NOTE — ED Notes (Signed)
First unit of blood infusing, pt resting no complaints at this time

## 2021-04-26 NOTE — ED Notes (Signed)
Blood stopped and complete at this time, lab called to obtain lab work

## 2021-04-26 NOTE — ED Notes (Signed)
Labs not drawn due to blood infusing.

## 2021-04-26 NOTE — ED Notes (Signed)
This RN asked pt if she voided and pt denies that she has not, purewick in place and pt educated to notify this RN when she did void, pt verbalized understanding.

## 2021-04-26 NOTE — ED Notes (Signed)
Pt given go-lytle and educated on use and that it needs to be fully consumed asap, pt verbalized understanding.

## 2021-04-26 NOTE — ED Notes (Signed)
Per Lab tech new HgB lab order placed so lab specimen will get run STAT

## 2021-04-27 ENCOUNTER — Inpatient Hospital Stay: Payer: Medicare Other

## 2021-04-27 ENCOUNTER — Inpatient Hospital Stay: Payer: Medicare Other | Admitting: Anesthesiology

## 2021-04-27 ENCOUNTER — Encounter
Admission: EM | Disposition: A | Discharge status: Skilled Nursing Facility | Payer: Self-pay | Source: Home / Self Care | Attending: Internal Medicine

## 2021-04-27 ENCOUNTER — Encounter: Payer: Self-pay | Admitting: Internal Medicine

## 2021-04-27 DIAGNOSIS — D696 Thrombocytopenia, unspecified: Secondary | ICD-10-CM

## 2021-04-27 DIAGNOSIS — I471 Supraventricular tachycardia: Secondary | ICD-10-CM | POA: Diagnosis not present

## 2021-04-27 DIAGNOSIS — I959 Hypotension, unspecified: Secondary | ICD-10-CM | POA: Diagnosis not present

## 2021-04-27 DIAGNOSIS — F1010 Alcohol abuse, uncomplicated: Secondary | ICD-10-CM | POA: Diagnosis not present

## 2021-04-27 DIAGNOSIS — D638 Anemia in other chronic diseases classified elsewhere: Secondary | ICD-10-CM | POA: Diagnosis not present

## 2021-04-27 DIAGNOSIS — E871 Hypo-osmolality and hyponatremia: Secondary | ICD-10-CM

## 2021-04-27 HISTORY — PX: ESOPHAGOGASTRODUODENOSCOPY: SHX5428

## 2021-04-27 HISTORY — PX: COLONOSCOPY: SHX5424

## 2021-04-27 LAB — CBC
HCT: 30.2 % — ABNORMAL LOW (ref 36.0–46.0)
Hemoglobin: 10.9 g/dL — ABNORMAL LOW (ref 12.0–15.0)
MCH: 31.9 pg (ref 26.0–34.0)
MCHC: 36.1 g/dL — ABNORMAL HIGH (ref 30.0–36.0)
MCV: 88.3 fL (ref 80.0–100.0)
Platelets: 97 10*3/uL — ABNORMAL LOW (ref 150–400)
RBC: 3.42 MIL/uL — ABNORMAL LOW (ref 3.87–5.11)
RDW: 14.8 % (ref 11.5–15.5)
WBC: 11 10*3/uL — ABNORMAL HIGH (ref 4.0–10.5)
nRBC: 0.7 % — ABNORMAL HIGH (ref 0.0–0.2)

## 2021-04-27 LAB — BASIC METABOLIC PANEL
Anion gap: 6 (ref 5–15)
BUN: 7 mg/dL — ABNORMAL LOW (ref 8–23)
CO2: 19 mmol/L — ABNORMAL LOW (ref 22–32)
Calcium: 7.3 mg/dL — ABNORMAL LOW (ref 8.9–10.3)
Chloride: 109 mmol/L (ref 98–111)
Creatinine, Ser: 0.43 mg/dL — ABNORMAL LOW (ref 0.44–1.00)
GFR, Estimated: >60 mL/min (ref >60)
Glucose, Bld: 68 mg/dL — ABNORMAL LOW (ref 70–99)
Potassium: 3.1 mmol/L — ABNORMAL LOW (ref 3.5–5.1)
Sodium: 134 mmol/L — ABNORMAL LOW (ref 135–145)

## 2021-04-27 SURGERY — COLONOSCOPY
Anesthesia: General

## 2021-04-27 MED ORDER — PHENYLEPHRINE HCL (PRESSORS) 10 MG/ML IV SOLN
INTRAVENOUS | Status: DC | PRN
  Administered 2021-04-27 (×6): 100 ug via INTRAVENOUS

## 2021-04-27 MED ORDER — IPRATROPIUM-ALBUTEROL 0.5-2.5 (3) MG/3ML IN SOLN
3.0000 mL | Freq: Once | RESPIRATORY_TRACT | Status: AC
  Administered 2021-04-27: 3 mL via RESPIRATORY_TRACT

## 2021-04-27 MED ORDER — PROPOFOL 500 MG/50ML IV EMUL
INTRAVENOUS | Status: AC
  Filled 2021-04-27: qty 50

## 2021-04-27 MED ORDER — IOHEXOL 9 MG/ML PO SOLN
500.0000 mL | ORAL | Status: AC
  Administered 2021-04-27 (×2): 500 mL via ORAL

## 2021-04-27 MED ORDER — LIDOCAINE HCL (CARDIAC) PF 100 MG/5ML IV SOSY
PREFILLED_SYRINGE | INTRAVENOUS | Status: DC | PRN
  Administered 2021-04-27: 30 mg via INTRAVENOUS

## 2021-04-27 MED ORDER — PHENYLEPHRINE HCL (PRESSORS) 10 MG/ML IV SOLN
INTRAVENOUS | Status: AC
  Filled 2021-04-27: qty 1

## 2021-04-27 MED ORDER — IPRATROPIUM-ALBUTEROL 0.5-2.5 (3) MG/3ML IN SOLN
RESPIRATORY_TRACT | Status: AC
  Filled 2021-04-27: qty 3

## 2021-04-27 MED ORDER — PROPOFOL 500 MG/50ML IV EMUL
INTRAVENOUS | Status: DC | PRN
  Administered 2021-04-27: 75 ug/kg/min via INTRAVENOUS

## 2021-04-27 MED ORDER — SODIUM CHLORIDE 0.9 % IV SOLN: INTRAVENOUS | Status: DC

## 2021-04-27 MED ORDER — EPHEDRINE 5 MG/ML INJ
INTRAVENOUS | Status: AC
  Filled 2021-04-27: qty 5

## 2021-04-27 MED ORDER — IOHEXOL 300 MG/ML  SOLN
75.0000 mL | Freq: Once | INTRAMUSCULAR | Status: AC | PRN
  Administered 2021-04-27: 18:00:00 75 mL via INTRAVENOUS

## 2021-04-27 MED ORDER — LIDOCAINE HCL (PF) 2 % IJ SOLN
INTRAMUSCULAR | Status: AC
  Filled 2021-04-27: qty 5

## 2021-04-27 MED ORDER — POTASSIUM CHLORIDE CRYS ER 20 MEQ PO TBCR
40.0000 meq | EXTENDED_RELEASE_TABLET | Freq: Once | ORAL | Status: AC
  Administered 2021-04-27: 40 meq via ORAL
  Filled 2021-04-27: qty 2

## 2021-04-27 NOTE — Progress Notes (Signed)
PT Cancellation Note:  Pt currently off floor for procedure.  Will re-attempt PT evaluation at a later date/time.  Hendricks Limes, PT 04/27/21, 1:33 PM

## 2021-04-27 NOTE — Op Note (Signed)
[facility] Patient Name: Peggy Brown Procedure Date: 04/27/2021 11:44 AM MRN: [id number] Account #: [id number] Date of Birth: September 12, 1956 Admit Type: Outpatient Age: 65 Room: [facility] ENDO ROOM 1 Gender: Female Note Status: Finalized Procedure:             Upper GI endoscopy Indications:           Iron deficiency anemia Providers:             Andrey Farmer MD, MD Referring MD:          No Local Md, MD (Referring MD) Medicines:             Monitored Anesthesia Care Complications:         No immediate complications. Estimated blood loss:                         Minimal. Procedure:             Pre-Anesthesia Assessment:                        - Prior to the procedure, a History and Physical was                         performed, and patient medications and allergies were                         reviewed. The patient is competent. The risks and                         benefits of the procedure and the sedation options and                         risks were discussed with the patient. All questions                         were answered and informed consent was obtained.                         Patient identification and proposed procedure were                         verified by the physician, the nurse, the anesthetist                         and the technician in the endoscopy suite. Mental                         Status Examination: alert and oriented. Airway                         Examination: normal oropharyngeal airway and neck                         mobility. Respiratory Examination: clear to                         auscultation. CV Examination: normal. Prophylactic  Antibiotics: The patient does not require prophylactic                         antibiotics. Prior Anticoagulants: The patient has                         taken no previous anticoagulant or antiplatelet                         agents. ASA Grade  Assessment: III - A patient with                         severe systemic disease. After reviewing the risks and                         benefits, the patient was deemed in satisfactory                         condition to undergo the procedure. The anesthesia                         plan was to use monitored anesthesia care (MAC).                         Immediately prior to administration of medications,                         the patient was re-assessed for adequacy to receive                         sedatives. The heart rate, respiratory rate, oxygen                         saturations, blood pressure, adequacy of pulmonary                         ventilation, and response to care were monitored                         throughout the procedure. The physical status of the                         patient was re-assessed after the procedure.                        After obtaining informed consent, the endoscope was                         passed under direct vision. Throughout the procedure,                         the patient's blood pressure, pulse, and oxygen                         saturations were monitored continuously. The Endoscope                         was introduced through the mouth, and advanced to the  second part of duodenum. The upper GI endoscopy was                         accomplished without difficulty. The patient tolerated                         the procedure well. Findings:      A non-bleeding diverticulum with a small opening and no stigmata of       recent bleeding was found in the lower third of the esophagus.      A non-bleeding diverticulum with a large opening and no stigmata of       recent bleeding was found in the middle third of the esophagus.      A single 7 mm nodule with a localized distribution was found in the       lower third of the esophagus. Biopsies were taken with a cold forceps       for histology. Estimated blood loss  was minimal.      Patchy mild inflammation characterized by erythema was found in the       entire examined stomach. Biopsies were taken with a cold forceps for       Helicobacter pylori testing. Estimated blood loss was minimal.      The examined duodenum was normal. Impression:            - Diverticulum in the lower third of the esophagus.                        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                        - Nodule found in the esophagus. Biopsied.                        - Gastritis. Biopsied.                        - Normal examined duodenum. Recommendation:        - Perform a colonoscopy today. Procedure Code(s):     --- Professional ---                        [phone], Esophagogastroduodenoscopy, flexible,                         transoral; with biopsy, single or multiple Diagnosis Code(s):     --- Professional ---                        Q39.6, Congenital diverticulum of esophagus                        K22.8, Other specified diseases of esophagus                        K29.70, Gastritis, unspecified, without bleeding                        D50.9, Iron deficiency anemia, unspecified CPT copyright 2019 American Medical Association. All rights reserved. The codes documented in this report are preliminary and upon coder review may  be revised  to meet current compliance requirements. Andrey Farmer MD, MD 04/27/2021 1:49:36 PM Number of Addenda: 0 Note Initiated On: 04/27/2021 11:44 AM Estimated Blood Loss:  Estimated blood loss was minimal.      [facility]

## 2021-04-27 NOTE — Anesthesia Procedure Notes (Signed)
Date/Time: 04/27/2021 1:11 PM Performed by: Tonia Ghent Pre-anesthesia Checklist: Patient identified, Emergency Drugs available, Suction available, Patient being monitored and Timeout performed Patient Re-evaluated:Patient Re-evaluated prior to induction Oxygen Delivery Method: Nasal cannula Preoxygenation: Pre-oxygenation with 100% oxygen Induction Type: IV induction Airway Equipment and Method: Bite block Placement Confirmation: positive ETCO2 and CO2 detector

## 2021-04-27 NOTE — Progress Notes (Signed)
PT Cancellation Note  Patient Details Name: Peggy Brown MRN: [id number] DOB: 1955-11-10   Cancelled Treatment:    Reason Eval/Treat Not Completed: Other (comment).  PT consult received.  Chart reviewed.  Pt currently still drinking bowel prep for planned colonoscopy today.  Will re-attempt PT evaluation at a later date/time.  Hendricks Limes, PT 04/27/21, 9:04 AM

## 2021-04-27 NOTE — TOC Initial Note (Signed)
Transition of Care [facility]) - Initial/Assessment Note    Patient Details  Name: Peggy Brown MRN: [id number] Date of Birth: [birth year]  [facility] CM/SW Contact:    Allayne Butcher, RN Phone Number: 04/27/2021, 3:05 PM  Clinical Narrative:                 Patient admitted to the hospital with SVT.  Patient was also found to have a hgb as low as 5.4 and received a blood transfusion.  Patient went for EGD today to see if they can see a source of bleeding.  Patient has a history of chronic alcohol use.  Patient reports to this RNCM at the beside that she drinks a 40oz beer a day but not every day.  She denies ever thinking about quitting alcohol.  RNCM will provide patient with resources that can help with substance abuse and provide treatment options.    Patient reports living at home with her daughter and daughter's finance.  Patient is independent at home.  She does not currently have a PCP.    Expected Discharge Plan: Home/Self Care Barriers to Discharge: Continued Medical Work up   Patient Goals and CMS Choice Patient states their goals for this hospitalization and ongoing recovery are:: Patient wants to find out what is going on with her      Expected Discharge Plan and Services Expected Discharge Plan: Home/Self Care   Discharge Planning Services: CM Consult   Living arrangements for the past 2 months: Single Family Home                 DME Arranged: N/A DME Agency: NA                  Prior Living Arrangements/Services Living arrangements for the past 2 months: Single Family Home Lives with:: Adult Children Patient language and need for interpreter reviewed:: Yes Do you feel safe going back to the place where you live?: Yes      Need for Family Participation in Patient Care: Yes (Comment) (ETOH abuse, anemia) Care giver support system in place?: Yes (comment) (daughter)   Criminal Activity/Legal Involvement Pertinent to Current  Situation/Hospitalization: No - Comment as needed  Activities of Daily Living Home Assistive Devices/Equipment: None ADL Screening (condition at time of admission) Patient's cognitive ability adequate to safely complete daily activities?: Yes Is the patient deaf or have difficulty hearing?: No Does the patient have difficulty seeing, even when wearing glasses/contacts?: No Does the patient have difficulty concentrating, remembering, or making decisions?: Yes Patient able to express need for assistance with ADLs?: Yes Does the patient have difficulty dressing or bathing?: No Independently performs ADLs?: Yes (appropriate for developmental age) Does the patient have difficulty walking or climbing stairs?: Yes Weakness of Legs: Both Weakness of Arms/Hands: None  Permission Sought/Granted   Permission granted to share information with : No              Emotional Assessment Appearance:: Appears older than stated age Attitude/Demeanor/Rapport: Engaged Affect (typically observed): Accepting Orientation: : Oriented to Self, Oriented to Place, Oriented to  Time, Oriented to Situation Alcohol / Substance Use: Not Applicable Psych Involvement: No (comment)  Admission diagnosis:  Hypokalemia [E87.6] SVT (supraventricular tachycardia) (HCC) [I47.1] Thrombocytopenia (HCC) [D69.6] Elevated liver enzymes [R74.8] Acute on chronic blood loss anemia [D62] Patient Active Problem List   Diagnosis Date Noted   Acute on chronic blood loss anemia 04/26/2021   Hypotension    Alcohol abuse  Hypokalemia    Hypomagnesemia    SVT (supraventricular tachycardia) (HCC) 04/25/2021   Severe protein-calorie malnutrition (HCC) 04/21/2018   Hyponatremia 04/19/2018   PCP:  Patient, No Pcp Per (Inactive) Pharmacy:   [facility] [address] ([address]), [place] - [address] [place]) [place] [address] Phone: [phone] Fax: [phone]     Social  Determinants of Health (SDOH) Interventions    Readmission Risk Interventions No flowsheet data found.

## 2021-04-27 NOTE — Op Note (Signed)
[facility] Gastroenterology Patient Name: Peggy Brown Procedure Date: 04/27/2021 11:42 AM MRN: [id number] Account #: [id number] Date of Birth: Nov 04, 1955 Admit Type: Outpatient Age: 65 Room: [facility] ENDO ROOM 1 Gender: Female Note Status: Finalized Procedure:             Colonoscopy Indications:           Iron deficiency anemia Providers:             Andrey Farmer MD, MD Referring MD:          No Local Md, MD (Referring MD) Medicines:             Monitored Anesthesia Care Complications:         No immediate complications. Estimated blood loss:                         Minimal. Procedure:             Pre-Anesthesia Assessment:                        - Prior to the procedure, a History and Physical was                         performed, and patient medications and allergies were                         reviewed. The patient is competent. The risks and                         benefits of the procedure and the sedation options and                         risks were discussed with the patient. All questions                         were answered and informed consent was obtained.                         Patient identification and proposed procedure were                         verified by the physician, the nurse, the anesthetist                         and the technician in the endoscopy suite. Mental                         Status Examination: alert and oriented. Airway                         Examination: normal oropharyngeal airway and neck                         mobility. Respiratory Examination: clear to                         auscultation. CV Examination: normal. Prophylactic                         Antibiotics:  The patient does not require prophylactic                         antibiotics. Prior Anticoagulants: The patient has                         taken no previous anticoagulant or antiplatelet                         agents. ASA Grade Assessment: III -  A patient with                         severe systemic disease. After reviewing the risks and                         benefits, the patient was deemed in satisfactory                         condition to undergo the procedure. The anesthesia                         plan was to use monitored anesthesia care (MAC).                         Immediately prior to administration of medications,                         the patient was re-assessed for adequacy to receive                         sedatives. The heart rate, respiratory rate, oxygen                         saturations, blood pressure, adequacy of pulmonary                         ventilation, and response to care were monitored                         throughout the procedure. The physical status of the                         patient was re-assessed after the procedure.                        After obtaining informed consent, the colonoscope was                         passed under direct vision. Throughout the procedure,                         the patient's blood pressure, pulse, and oxygen                         saturations were monitored continuously. The                         Colonoscope was introduced through the anus and  advanced to the the terminal ileum. The colonoscopy                         was performed without difficulty. The patient                         tolerated the procedure well. The quality of the bowel                         preparation was good. Findings:      The perianal and digital rectal examinations were normal.      The terminal ileum appeared normal. Biopsies were taken with a cold       forceps for histology. Estimated blood loss was minimal.      A 11 mm polyp was found in the cecum. The polyp was sessile. The polyp       was removed with a piecemeal technique using a hot snare. Resection and       retrieval were complete. Estimated blood loss was minimal.      A 1 mm polyp  was found in the cecum. The polyp was sessile. The polyp       was removed with a jumbo cold forceps. Resection and retrieval were       complete. Estimated blood loss was minimal.      A 5 mm polyp was found in the descending colon. The polyp was sessile.       The polyp was removed with a cold snare. Resection and retrieval were       complete. Estimated blood loss was minimal.      Internal hemorrhoids were found during retroflexion. The hemorrhoids       were Grade II (internal hemorrhoids that prolapse but reduce       spontaneously).      The exam was otherwise without abnormality on direct and retroflexion       views. Impression:            - The examined portion of the ileum was normal.                         Biopsied.                        - One 11 mm polyp in the cecum, removed piecemeal                         using a hot snare. Resected and retrieved.                        - One 1 mm polyp in the cecum, removed with a jumbo                         cold forceps. Resected and retrieved.                        - One 5 mm polyp in the descending colon, removed with                         a cold snare. Resected and retrieved.                        -  Internal hemorrhoids.                        - The examination was otherwise normal on direct and                         retroflexion views. Recommendation:        - Return patient to hospital ward for possible                         discharge same day.                        - Advance diet as tolerated.                        - Continue present medications.                        - Await pathology results.                        - Repeat colonoscopy in 6 months for surveillance                         after piecemeal polypectomy.                        - Return to GI clinic at appointment to be scheduled. Procedure Code(s):     --- Professional ---                        [phone], Colonoscopy, flexible; with removal of                          tumor(s), polyp(s), or other lesion(s) by snare                         technique                        45380, 61, Colonoscopy, flexible; with biopsy, single                         or multiple Diagnosis Code(s):     --- Professional ---                        K64.1, Second degree hemorrhoids                        K63.5, Polyp of colon                        D50.9, Iron deficiency anemia, unspecified CPT copyright 2019 American Medical Association. All rights reserved. The codes documented in this report are preliminary and upon coder review may  be revised to meet current compliance requirements. Andrey Farmer MD, MD 04/27/2021 1:53:25 PM Number of Addenda: 0 Note Initiated On: 04/27/2021 11:42 AM Scope Withdrawal Time: 0 hours 25 minutes 47 seconds  Total Procedure Duration: 0 hours 31 minutes 17 seconds  Estimated Blood Loss:  Estimated blood loss was minimal.      [place]  [facility]

## 2021-04-27 NOTE — Progress Notes (Signed)
Pt is having a hard time drinking Golytely, reports nausea and vomiting, Zofran administered earlier. Encouragement and education has been provided to patient.

## 2021-04-27 NOTE — Progress Notes (Addendum)
Patient ID: Peggy Brown, female   DOB: January 10, 1956, 65 y.o.   MRN: [id number] [facility] PROGRESS NOTE  Peggy Brown EUM:[id number] DOB: 05/04/1956 DOA: 04/25/2021 PCP: Patient, No Pcp Per (Inactive)  HPI/Subjective: Patient seen this morning was having diarrhea with colonoscopy prep.  Very weak.  No nausea or vomiting.  Denied blood in the bowel movements.  Initially admitted with hypotension SVT and severe anemia with hemoglobin down to 5.4.  Objective: Vitals:   04/27/21 1431 04/27/21 1457  BP: 106/61 104/64  Pulse: 76 70  Resp: (!) 21 18  Temp:  97.9 F (36.6 C)  SpO2: 100% 92%    Intake/Output Summary (Last 24 hours) at 04/27/2021 1640 Last data filed at 04/27/2021 1305 Gross per 24 hour  Intake --  Output 80 ml  Net -80 ml   There were no vitals filed for this visit.  ROS: Review of Systems  Respiratory:  Negative for shortness of breath.   Cardiovascular:  Negative for chest pain.  Gastrointestinal:  Positive for diarrhea. Negative for abdominal pain, nausea and vomiting.  Exam: Physical Exam HENT:     Head: Normocephalic.     Mouth/Throat:     Pharynx: No oropharyngeal exudate.  Eyes:     General: Lids are normal.     Conjunctiva/sclera: Conjunctivae normal.  Cardiovascular:     Rate and Rhythm: Normal rate and regular rhythm.     Heart sounds: Normal heart sounds, S1 normal and S2 normal.  Pulmonary:     Breath sounds: Examination of the right-lower field reveals decreased breath sounds. Examination of the left-lower field reveals decreased breath sounds. Decreased breath sounds present. No wheezing, rhonchi or rales.  Abdominal:     Palpations: Abdomen is soft.     Tenderness: There is no abdominal tenderness.  Musculoskeletal:     Right lower leg: No swelling.     Left lower leg: No swelling.  Skin:    General: Skin is warm.     Findings: No rash.  Neurological:     Mental Status: She is alert.     Comments: Answer some yes or no  questions.     Data Reviewed: Basic Metabolic Panel: Recent Labs  Lab 04/25/21 1844 04/25/21 2317 04/26/21 0812 04/27/21 0442  NA 125* 129*  128* 130* 134*  K 2.8* 3.1* 3.7 3.1*  CL 93* 103 104 109  CO2 23 22 21* 19*  GLUCOSE 132* 101* 76 68*  BUN 11 9 9  7*  CREATININE 0.63 0.47 0.59 0.43*  CALCIUM 7.7* 6.7* 7.0* 7.3*  MG 1.9 1.6* 2.3  --   PHOS 3.1 2.8  --   --    Liver Function Tests: Recent Labs  Lab 04/25/21 1844 04/25/21 2317 04/26/21 0812  AST 32 19 27  ALT 12 10 14   ALKPHOS 87 69 70  BILITOT 1.1 0.9 2.1*  PROT 5.6* 4.3* 4.9*  ALBUMIN 2.1* 1.7* 1.9*    CBC: Recent Labs  Lab 04/25/21 1844 04/25/21 2317 04/26/21 0812 04/26/21 1020 04/26/21 2132 04/27/21 0442  WBC 14.7* 10.4 10.0  --   --  11.0*  NEUTROABS 11.0*  --   --   --   --   --   HGB 6.8* 5.4* 11.7* 11.7* 11.5* 10.9*  HCT 18.2* 14.4* 33.0*  --   --  30.2*  MCV 91.5 92.9 90.7  --   --  88.3  PLT 137* 81* 78*  --   --  97*     Recent Results (  from the past 240 hour(s))  Resp Panel by RT-PCR (Flu A&B, Covid) Nasopharyngeal Swab     Status: None   Collection Time: 04/25/21 11:17 PM   Specimen: Nasopharyngeal Swab; Nasopharyngeal(NP) swabs in vial transport medium  Result Value Ref Range Status   SARS Coronavirus 2 by RT PCR NEGATIVE NEGATIVE Final    Comment: (NOTE) SARS-CoV-2 target nucleic acids are NOT DETECTED.  The SARS-CoV-2 RNA is generally detectable in upper respiratory specimens during the acute phase of infection. The lowest concentration of SARS-CoV-2 viral copies this assay can detect is 138 copies/mL. A negative result does not preclude SARS-Cov-2 infection and should not be used as the sole basis for treatment or other patient management decisions. A negative result may occur with  improper specimen collection/handling, submission of specimen other than nasopharyngeal swab, presence of viral mutation(s) within the areas targeted by this assay, and inadequate number of  viral copies(<138 copies/mL). A negative result must be combined with clinical observations, patient history, and epidemiological information. The expected result is Negative.  Fact Sheet for Patients:  BloggerCourse.com  Fact Sheet for Healthcare Providers:  SeriousBroker.it  This test is no t yet approved or cleared by the [place] FDA and  has been authorized for detection and/or diagnosis of SARS-CoV-2 by FDA under an Emergency Use Authorization (EUA). This EUA will remain  in effect (meaning this test can be used) for the duration of the COVID-19 declaration under Section 564(b)(1) of the Act, 21 U.S.C.section 360bbb-3(b)(1), unless the authorization is terminated  or revoked sooner.       Influenza A by PCR NEGATIVE NEGATIVE Final   Influenza B by PCR NEGATIVE NEGATIVE Final    Comment: (NOTE) The Xpert Xpress SARS-CoV-2/FLU/RSV plus assay is intended as an aid in the diagnosis of influenza from Nasopharyngeal swab specimens and should not be used as a sole basis for treatment. Nasal washings and aspirates are unacceptable for Xpert Xpress SARS-CoV-2/FLU/RSV testing.  Fact Sheet for Patients: BloggerCourse.com  Fact Sheet for Healthcare Providers: SeriousBroker.it  This test is not yet approved or cleared by the [place] FDA and has been authorized for detection and/or diagnosis of SARS-CoV-2 by FDA under an Emergency Use Authorization (EUA). This EUA will remain in effect (meaning this test can be used) for the duration of the COVID-19 declaration under Section 564(b)(1) of the Act, 21 U.S.C. section 360bbb-3(b)(1), unless the authorization is terminated or revoked.  Performed at [facility], [address] [place], [place] [address]      Studies: US Abdomen Complete  Result Date: 04/26/2021 CLINICAL DATA:  Elevated liver enzymes,  thrombocytopenia EXAM: ABDOMEN ULTRASOUND COMPLETE COMPARISON:  None. FINDINGS: Gallbladder: There is sludge and small stones within a nondilated gallbladder. There is no wall thickening or pericholecystic fluid. Common bile duct: Diameter: 2.0 mm Liver: No focal lesion identified. Within normal limits in parenchymal echogenicity. Portal vein is patent on color Doppler imaging with normal direction of blood flow towards the liver. IVC: No abnormality visualized. Pancreas: Visualized portion unremarkable. Spleen: Size and appearance within normal limits. Right Kidney: Length: 10.8 cm.  No hydronephrosis or visible mass. Left Kidney: Length: 8.9 cm.  No hydronephrosis or visible mass. Abdominal aorta: Portions obscured by bowel gas. Some atherosclerotic disease noted. No aneurysm. Other findings: Right pleural effusion. IMPRESSION: Sludge and small stones within the gallbladder. No evidence of acute cholecystitis or biliary obstruction. Right pleural effusion. Electronically Signed   By: Caprice Renshaw   On: 04/26/2021 18:55   DG  Chest Portable 1 View  Result Date: 04/25/2021 CLINICAL DATA:  Pt states weakness x few days. Denies vomiting, diarrhea, fever. Pt denies pain. Speech clear. Moving all extremities. Pt states eyes blurry bilaterally. HR 200 in triage. EXAM: PORTABLE CHEST 1 VIEW COMPARISON:  04/19/2018 FINDINGS: Cardiac silhouette is normal in size. No mediastinal or hilar masses. Clear lungs.  No pleural effusion or pneumothorax. Skeletal structures are grossly intact. IMPRESSION: No active disease. Electronically Signed   By: Amie Portland M.D.   On: 04/25/2021 19:30    Scheduled Meds:  folic acid  1 mg Oral Daily   iohexol  500 mL Oral Q1H   ipratropium-albuterol       multivitamin with minerals  1 tablet Oral Daily   nicotine  14 mg Transdermal Daily   pantoprazole (PROTONIX) IV  40 mg Intravenous Q12H   thiamine  100 mg Oral Daily   Or   thiamine  100 mg Intravenous Daily   Continuous  Infusions:  0.9 % NaCl with KCl 20 mEq / L 40 mL/hr at 04/27/21 1227   Brief history.  Patient admitted 04/26/2019 2 in the evening with hypotension, SVT and found to be severely anemic.  Hemoglobin was 6.8 on presentation and dropped down to 5.4.  Iron studies more consistent with iron deficiency anemia.  Patient has a history of drinking 2-3 bottles of 40 ounce beer daily.  EGD showed erythema in the stomach, diverticulum of the esophagus and nodule in the esophagus.  Colonoscopy showed a 3 polyps.  Assessment/Plan:  Acute on chronic anemia.  Iron studies more consistent with anemia of chronic disease.  Endoscopy and colonoscopy did not show any signs of active bleeding.  Colonoscopy showed 3 polyps EGD showed erythema in the stomach and diverticulum of the esophagus.  Patient responded to 2 units of packed red blood cells.  Hemoglobin came up from 5.4 and is 10.9 today.  With iron studies showing anemia of chronic disease we will get a CT scan of the chest abdomen pelvis to look for another process with the anemia.  Advance diet to solid food. SVT and hypotension.  Patient's heart rate normal after transfusion of blood.  Blood pressure is also come up a bit with transfusion of blood.  Hesitant on medications for SVT currently. Alcohol abuse, thrombocytopenia.  Currently no signs of withdrawal.  On thiamine, multivitamin and folic acid. Hyponatremia secondary to alcohol.  Sodium has come up from 125 to 134. Hypokalemia.  Continue replace potassium and IV and orally Hypomagnesemia replaced Severe protein calorie malnutrition Weakness.  Physical therapy evaluation      Code Status:     Code Status Orders  (From admission, onward)           Start     Ordered   04/26/21 0738  Full code  Continuous        04/26/21 0737           Code Status History     Date Active Date Inactive Code Status Order ID Comments User Context   04/19/2018 1846 04/21/2018 2032 Full Code [id number]   Houston Siren, MD Inpatient      Family Communication: Spoke with daughter at the bedside Disposition Plan: Status is: Inpatient  Dispo: The patient is from: Home              Anticipated d/c is to: Home              Patient currently doing better with regards  to her hemoglobin.  Still needs to check her strength.  With iron studies showing anemia of chronic disease we will get a CT scan of the chest abdomen pelvis.   Difficult to place patient.  No.  Consultants: Gastroenterology  Procedures: EGD and colonoscopy  Time spent: 27 minutes  [employer]  [facility]

## 2021-04-27 NOTE — Transfer of Care (Signed)
Immediate Anesthesia Transfer of Care Note  Patient: Peggy Brown  Procedure(s) Performed: COLONOSCOPY ESOPHAGOGASTRODUODENOSCOPY (EGD)  Patient Location: PACU  Anesthesia Type:General  Level of Consciousness: awake and sedated  Airway & Oxygen Therapy: Patient Spontanous Breathing and Patient connected to nasal cannula oxygen  Post-op Assessment: Report given to RN and Post -op Vital signs reviewed and stable  Post vital signs: Reviewed and stable  Last Vitals:  Vitals Value Taken Time  BP    Temp    Pulse    Resp    SpO2      Last Pain:  Vitals:   04/27/21 1239  TempSrc: Temporal  PainSc: 0-No pain         Complications: No notable events documented.

## 2021-04-27 NOTE — Anesthesia Preprocedure Evaluation (Signed)
Anesthesia Evaluation  Patient identified by MRN, date of birth, ID band Patient awake    Reviewed: Allergy & Precautions, NPO status , Patient's Chart, lab work & pertinent test results  History of Anesthesia Complications Negative for: history of anesthetic complications  Airway Mallampati: III  TM Distance: <3 FB Neck ROM: limited    Dental  (+) Chipped, Poor Dentition, Missing   Pulmonary COPD, Current Smoker and Patient abstained from smoking.,    Pulmonary exam normal        Cardiovascular (-) angina(-) Past MI negative cardio ROS Normal cardiovascular exam     Neuro/Psych PSYCHIATRIC DISORDERS negative neurological ROS     GI/Hepatic negative GI ROS, Neg liver ROS, neg GERD  ,  Endo/Other  negative endocrine ROS  Renal/GU negative Renal ROS  negative genitourinary   Musculoskeletal   Abdominal   Peds  Hematology negative hematology ROS (+)   Anesthesia Other Findings Patient is NPO appropriate and reports no nausea or vomiting today.  Past Surgical History: No date: CESAREAN SECTION     Reproductive/Obstetrics negative OB ROS                             Anesthesia Physical Anesthesia Plan  ASA: 3  Anesthesia Plan: General   Post-op Pain Management:    Induction: Intravenous  PONV Risk Score and Plan: Propofol infusion and TIVA  Airway Management Planned: Natural Airway and Nasal Cannula  Additional Equipment:   Intra-op Plan:   Post-operative Plan:   Informed Consent: I have reviewed the patients History and Physical, chart, labs and discussed the procedure including the risks, benefits and alternatives for the proposed anesthesia with the patient or authorized representative who has indicated his/her understanding and acceptance.     Dental Advisory Given  Plan Discussed with: Anesthesiologist, CRNA and Surgeon  Anesthesia Plan Comments: (Patient  consented for risks of anesthesia including but not limited to:  - adverse reactions to medications - risk of airway placement if required - damage to eyes, teeth, lips or other oral mucosa - nerve damage due to positioning  - sore throat or hoarseness - Damage to heart, brain, nerves, lungs, other parts of body or loss of life  Patient voiced understanding.)        Anesthesia Quick Evaluation

## 2021-04-28 ENCOUNTER — Inpatient Hospital Stay (HOSPITAL_COMMUNITY)
Admit: 2021-04-28 | Discharge: 2021-04-28 | Disposition: A | Discharge status: Home / Self Care | Payer: Medicare Other | Attending: Internal Medicine | Admitting: Internal Medicine

## 2021-04-28 ENCOUNTER — Encounter: Payer: Self-pay | Admitting: Gastroenterology

## 2021-04-28 DIAGNOSIS — I5031 Acute diastolic (congestive) heart failure: Secondary | ICD-10-CM | POA: Diagnosis not present

## 2021-04-28 DIAGNOSIS — E43 Unspecified severe protein-calorie malnutrition: Secondary | ICD-10-CM | POA: Diagnosis not present

## 2021-04-28 DIAGNOSIS — I471 Supraventricular tachycardia: Secondary | ICD-10-CM

## 2021-04-28 DIAGNOSIS — D62 Acute posthemorrhagic anemia: Secondary | ICD-10-CM | POA: Diagnosis not present

## 2021-04-28 LAB — BPAM RBC
Blood Product Expiration Date: 202208212359
Blood Product Expiration Date: 202208222359
Blood Product Expiration Date: 202208222359
Blood Product Expiration Date: 202208222359
Blood Product Expiration Date: 202208262359
ISSUE DATE / TIME: 202207250115
ISSUE DATE / TIME: 202207250432
ISSUE DATE / TIME: 202207270347
ISSUE DATE / TIME: 202207270347
Unit Type and Rh: 5100
Unit Type and Rh: 5100
Unit Type and Rh: 5100
Unit Type and Rh: 5100
Unit Type and Rh: 5100

## 2021-04-28 LAB — TYPE AND SCREEN
ABO/RH(D): O POS
Antibody Screen: POSITIVE
Unit division: 0
Unit division: 0
Unit division: 0
Unit division: 0
Unit division: 0

## 2021-04-28 LAB — CBC
HCT: 29.5 % — ABNORMAL LOW (ref 36.0–46.0)
Hemoglobin: 10.6 g/dL — ABNORMAL LOW (ref 12.0–15.0)
MCH: 32.3 pg (ref 26.0–34.0)
MCHC: 35.9 g/dL (ref 30.0–36.0)
MCV: 89.9 fL (ref 80.0–100.0)
Platelets: 123 10*3/uL — ABNORMAL LOW (ref 150–400)
RBC: 3.28 MIL/uL — ABNORMAL LOW (ref 3.87–5.11)
RDW: 15.5 % (ref 11.5–15.5)
WBC: 13.8 10*3/uL — ABNORMAL HIGH (ref 4.0–10.5)
nRBC: 0.4 % — ABNORMAL HIGH (ref 0.0–0.2)

## 2021-04-28 LAB — COMPREHENSIVE METABOLIC PANEL
ALT: 14 U/L (ref 0–44)
AST: 28 U/L (ref 15–41)
Albumin: 1.6 g/dL — ABNORMAL LOW (ref 3.5–5.0)
Alkaline Phosphatase: 76 U/L (ref 38–126)
Anion gap: 4 — ABNORMAL LOW (ref 5–15)
BUN: 6 mg/dL — ABNORMAL LOW (ref 8–23)
CO2: 19 mmol/L — ABNORMAL LOW (ref 22–32)
Calcium: 7.5 mg/dL — ABNORMAL LOW (ref 8.9–10.3)
Chloride: 109 mmol/L (ref 98–111)
Creatinine, Ser: 0.53 mg/dL (ref 0.44–1.00)
GFR, Estimated: >60 mL/min (ref >60)
Glucose, Bld: 85 mg/dL (ref 70–99)
Potassium: 3.6 mmol/L (ref 3.5–5.1)
Sodium: 132 mmol/L — ABNORMAL LOW (ref 135–145)
Total Bilirubin: 0.9 mg/dL (ref 0.3–1.2)
Total Protein: 4.4 g/dL — ABNORMAL LOW (ref 6.5–8.1)

## 2021-04-28 LAB — MAGNESIUM: Magnesium: 1.6 mg/dL — ABNORMAL LOW (ref 1.7–2.4)

## 2021-04-28 LAB — PHOSPHORUS: Phosphorus: 2.3 mg/dL — ABNORMAL LOW (ref 2.5–4.6)

## 2021-04-28 LAB — BRAIN NATRIURETIC PEPTIDE: B Natriuretic Peptide: 212.6 pg/mL — ABNORMAL HIGH (ref 0.0–100.0)

## 2021-04-28 MED ORDER — K PHOS MONO-SOD PHOS DI & MONO 155-852-130 MG PO TABS
500.0000 mg | ORAL_TABLET | ORAL | Status: AC
  Administered 2021-04-28 (×2): 500 mg via ORAL
  Filled 2021-04-28 (×2): qty 2

## 2021-04-28 MED ORDER — SODIUM BICARBONATE 650 MG PO TABS
1300.0000 mg | ORAL_TABLET | Freq: Two times a day (BID) | ORAL | Status: DC
  Administered 2021-04-28 – 2021-04-30 (×5): 1300 mg via ORAL
  Filled 2021-04-28 (×8): qty 2

## 2021-04-28 MED ORDER — MAGNESIUM SULFATE 2 GM/50ML IV SOLN
2.0000 g | Freq: Once | INTRAVENOUS | Status: AC
  Administered 2021-04-28: 09:00:00 2 g via INTRAVENOUS
  Filled 2021-04-28: qty 50

## 2021-04-28 MED ORDER — ENSURE ENLIVE PO LIQD
237.0000 mL | Freq: Two times a day (BID) | ORAL | Status: DC
  Administered 2021-04-28 – 2021-04-30 (×3): 237 mL via ORAL

## 2021-04-28 NOTE — Progress Notes (Signed)
PROGRESS NOTE    Peggy Brown  HUT:[id number] DOB: 1955/10/07 DOA: 04/25/2021 PCP: Patient, No Pcp Per (Inactive)    Brief Narrative:   Patient admitted 04/26/2019 2 in the evening with hypotension, SVT and found to be severely anemic.  Hemoglobin was 6.8 on presentation and dropped down to 5.4.  Iron studies more consistent with iron deficiency anemia.  Patient has a history of drinking 2-3 bottles of 40 ounce beer daily.  EGD showed erythema in the stomach, diverticulum of the esophagus and nodule in the esophagus.  Colonoscopy showed a 3 polyps.   Assessment & Plan:   Active Problems:   Severe protein-calorie malnutrition (HCC)   SVT (supraventricular tachycardia) (HCC)   Acute on chronic blood loss anemia   Anemia of chronic disease   Thrombocytopenia (HCC)  #1.  Acute on chronic anemia. Iron study did not show any evidence of iron deficient anemia. Received blood transfusion, hemoglobin is more stable.  No active bleeding. EGD showed gastritis and esophagitis.  #2.  Severe protein calorie malnutrition. Severe hypoalbuminemia. Anasarca. Patient appears severely malnourished, albumin 1.6.  However, I reviewed the patient CT scan of the chest/abdomen/pelvis.  Reviewed the images.  Patient has some pleural effusion, anasarca.  BNP mildly elevated, no evidence of congestive heart failure.  Obtain echocardiogram.  No evidence of liver cirrhosis on ultrasound.  Anasarca appears to be due to severe hypoalbuminemia.  This is most likely due to alcohol drinking, and poor protein intake. Discussed with the patient and daughter to quit alcohol drinking. Will start protein supplements.   #3.  SVT and hypotension. Appears to be has secondary to severe anemia. No recurrence.  4.  Alcohol abuse. Thrombocytopenia secondary to alcohol abuse. No evidence of alcohol withdrawal. Continue thiamine folic acid.  4.  Hyponatremia Hypokalemia Hypomagnesemia Give additional 2 g of magnesium  sulfate.  Sodium level is better, potassium normalized.  DVT prophylaxis: SCDs Code Status: full Family Communication: daughter updated Disposition Plan:    Status is: Inpatient  Remains inpatient appropriate because:Ongoing diagnostic testing needed not appropriate for outpatient work up and Inpatient level of care appropriate due to severity of illness  Dispo: The patient is from: Home              Anticipated d/c is to: Home              Patient currently is not medically stable to d/c.   Difficult to place patient No        I/O last 3 completed shifts: In: 974.9 [I.V.:974.9] Out: 80 [Emesis/NG output:80] No intake/output data recorded.     Consultants:  gi  Procedures: egd, colonoscopy  Antimicrobials: none   Subjective: Patient feels well today, he denies any short of breath or cough. She has good appetite without nausea vomiting. No cough. No chest pain palpitation No dysuria hematuria No fever or chills.   Objective: Vitals:   04/28/21 0153 04/28/21 0515 04/28/21 0738 04/28/21 1137  BP: 98/70 103/63 108/87 97/75  Pulse: 91 81 88 85  Resp:  16 18 18   Temp: 98 F (36.7 C) 97.8 F (36.6 C) 98 F (36.7 C) 97.7 F (36.5 C)  TempSrc:   Oral   SpO2: 100% 98% 99% 100%    Intake/Output Summary (Last 24 hours) at 04/28/2021 1206 Last data filed at 04/27/2021 1700 Gross per 24 hour  Intake 974.88 ml  Output 0 ml  Net 974.88 ml   There were no vitals filed for this visit.  Examination:  General exam: Appears calm and comfortable, severely malnourished Respiratory system: Clear to auscultation. Respiratory effort normal. Cardiovascular system: S1 & S2 heard, RRR. No JVD, murmurs, rubs, gallops or clicks. No pedal edema. Gastrointestinal system: Abdomen is nondistended, soft and nontender. No organomegaly or masses felt. Normal bowel sounds heard. Central nervous system: Alert and oriented. No focal neurological deficits. Extremities: Symmetric 5 x  5 power. Skin: No rashes, lesions or ulcers Psychiatry: Mood & affect appropriate.     Data Reviewed: I have personally reviewed following labs and imaging studies  CBC: Recent Labs  Lab 04/25/21 1844 04/25/21 2317 04/26/21 0812 04/26/21 1020 04/26/21 2132 04/27/21 0442 04/28/21 0443  WBC 14.7* 10.4 10.0  --   --  11.0* 13.8*  NEUTROABS 11.0*  --   --   --   --   --   --   HGB 6.8* 5.4* 11.7* 11.7* 11.5* 10.9* 10.6*  HCT 18.2* 14.4* 33.0*  --   --  30.2* 29.5*  MCV 91.5 92.9 90.7  --   --  88.3 89.9  PLT 137* 81* 78*  --   --  97* 123*   Basic Metabolic Panel: Recent Labs  Lab 04/25/21 1844 04/25/21 2317 04/26/21 0812 04/27/21 0442 04/28/21 0443  NA 125* 129*  128* 130* 134* 132*  K 2.8* 3.1* 3.7 3.1* 3.6  CL 93* 103 104 109 109  CO2 23 22 21* 19* 19*  GLUCOSE 132* 101* 76 68* 85  BUN 11 9 9  7* 6*  CREATININE 0.63 0.47 0.59 0.43* 0.53  CALCIUM 7.7* 6.7* 7.0* 7.3* 7.5*  MG 1.9 1.6* 2.3  --  1.6*  PHOS 3.1 2.8  --   --  2.3*   GFR: CrCl cannot be calculated (Unknown ideal weight.). Liver Function Tests: Recent Labs  Lab 04/25/21 1844 04/25/21 2317 04/26/21 0812 04/28/21 0443  AST 32 19 27 28   ALT 12 10 14 14   ALKPHOS 87 69 70 76  BILITOT 1.1 0.9 2.1* 0.9  PROT 5.6* 4.3* 4.9* 4.4*  ALBUMIN 2.1* 1.7* 1.9* 1.6*   No results for input(s): LIPASE, AMYLASE in the last 168 hours. No results for input(s): AMMONIA in the last 168 hours. Coagulation Profile: Recent Labs  Lab 04/26/21 0812  INR 1.1   Cardiac Enzymes: No results for input(s): CKTOTAL, CKMB, CKMBINDEX, TROPONINI in the last 168 hours. BNP (last 3 results) No results for input(s): PROBNP in the last 8760 hours. HbA1C: No results for input(s): HGBA1C in the last 72 hours. CBG: No results for input(s): GLUCAP in the last 168 hours. Lipid Profile: No results for input(s): CHOL, HDL, LDLCALC, TRIG, CHOLHDL, LDLDIRECT in the last 72 hours. Thyroid Function Tests: Recent Labs     04/26/21 0812  TSH 2.388   Anemia Panel: Recent Labs    04/25/21 1844 04/25/21 2317  VITAMINB12  --  967*  FOLATE 10.6  --   FERRITIN 406*  --   TIBC 122*  --   IRON 63  --   RETICCTPCT  --  5.7*   Sepsis Labs: No results for input(s): PROCALCITON, LATICACIDVEN in the last 168 hours.  Recent Results (from the past 240 hour(s))  Resp Panel by RT-PCR (Flu A&B, Covid) Nasopharyngeal Swab     Status: None   Collection Time: 04/25/21 11:17 PM   Specimen: Nasopharyngeal Swab; Nasopharyngeal(NP) swabs in vial transport medium  Result Value Ref Range Status   SARS Coronavirus 2 by RT PCR NEGATIVE NEGATIVE Final    Comment: (NOTE) SARS-CoV-2  target nucleic acids are NOT DETECTED.  The SARS-CoV-2 RNA is generally detectable in upper respiratory specimens during the acute phase of infection. The lowest concentration of SARS-CoV-2 viral copies this assay can detect is 138 copies/mL. A negative result does not preclude SARS-Cov-2 infection and should not be used as the sole basis for treatment or other patient management decisions. A negative result may occur with  improper specimen collection/handling, submission of specimen other than nasopharyngeal swab, presence of viral mutation(s) within the areas targeted by this assay, and inadequate number of viral copies(<138 copies/mL). A negative result must be combined with clinical observations, patient history, and epidemiological information. The expected result is Negative.  Fact Sheet for Patients:  BloggerCourse.com  Fact Sheet for Healthcare Providers:  SeriousBroker.it  This test is no t yet approved or cleared by the [place] FDA and  has been authorized for detection and/or diagnosis of SARS-CoV-2 by FDA under an Emergency Use Authorization (EUA). This EUA will remain  in effect (meaning this test can be used) for the duration of the COVID-19 declaration under Section  564(b)(1) of the Act, 21 U.S.C.section 360bbb-3(b)(1), unless the authorization is terminated  or revoked sooner.       Influenza A by PCR NEGATIVE NEGATIVE Final   Influenza B by PCR NEGATIVE NEGATIVE Final    Comment: (NOTE) The Xpert Xpress SARS-CoV-2/FLU/RSV plus assay is intended as an aid in the diagnosis of influenza from Nasopharyngeal swab specimens and should not be used as a sole basis for treatment. Nasal washings and aspirates are unacceptable for Xpert Xpress SARS-CoV-2/FLU/RSV testing.  Fact Sheet for Patients: BloggerCourse.com  Fact Sheet for Healthcare Providers: SeriousBroker.it  This test is not yet approved or cleared by the [place] FDA and has been authorized for detection and/or diagnosis of SARS-CoV-2 by FDA under an Emergency Use Authorization (EUA). This EUA will remain in effect (meaning this test can be used) for the duration of the COVID-19 declaration under Section 564(b)(1) of the Act, 21 U.S.C. section 360bbb-3(b)(1), unless the authorization is terminated or revoked.  Performed at [facility], [address], [place], [place] [address]          Radiology Studies: US Abdomen Complete  Result Date: 04/26/2021 CLINICAL DATA:  Elevated liver enzymes, thrombocytopenia EXAM: ABDOMEN ULTRASOUND COMPLETE COMPARISON:  None. FINDINGS: Gallbladder: There is sludge and small stones within a nondilated gallbladder. There is no wall thickening or pericholecystic fluid. Common bile duct: Diameter: 2.0 mm Liver: No focal lesion identified. Within normal limits in parenchymal echogenicity. Portal vein is patent on color Doppler imaging with normal direction of blood flow towards the liver. IVC: No abnormality visualized. Pancreas: Visualized portion unremarkable. Spleen: Size and appearance within normal limits. Right Kidney: Length: 10.8 cm.  No hydronephrosis or visible mass. Left Kidney:  Length: 8.9 cm.  No hydronephrosis or visible mass. Abdominal aorta: Portions obscured by bowel gas. Some atherosclerotic disease noted. No aneurysm. Other findings: Right pleural effusion. IMPRESSION: Sludge and small stones within the gallbladder. No evidence of acute cholecystitis or biliary obstruction. Right pleural effusion. Electronically Signed   By: Caprice Renshaw   On: 04/26/2021 18:55   CT CHEST ABDOMEN PELVIS W CONTRAST  Result Date: 04/28/2021 CLINICAL DATA:  Unintended weight loss EXAM: CT CHEST, ABDOMEN, AND PELVIS WITH CONTRAST TECHNIQUE: Multidetector CT imaging of the chest, abdomen and pelvis was performed following the standard protocol during bolus administration of intravenous contrast. CONTRAST:  80mL OMNIPAQUE IOHEXOL 300 MG/ML  SOLN COMPARISON:  Abdominal  ultrasound 04/26/2021 FINDINGS: CT CHEST FINDINGS Cardiovascular: Conventional 3 vessel arch anatomy. Scattered atherosclerotic calcifications throughout the thoracic aorta. No evidence of aneurysm. The heart is normal in size. No pericardial effusion. Unremarkable size of the main and central pulmonary arteries. Mediastinum/Nodes: Unremarkable CT appearance of the thyroid gland. No suspicious mediastinal or hilar adenopathy. No soft tissue mediastinal mass. The thoracic esophagus is unremarkable. Lungs/Pleura: Small to moderate bilateral layering pleural effusions. Mild interlobular septal thickening. Centrilobular pulmonary emphysema and diffuse mild bronchial wall thickening. Compressive atelectasis in both lower lobes secondary to the presence of the pleural effusions. No focal airspace infiltrate, pulmonary mass or pulmonary nodule. Musculoskeletal: No acute fracture or aggressive appearing lytic or blastic osseous lesion. CT ABDOMEN PELVIS FINDINGS Hepatobiliary: Normal hepatic contour and morphology. No discrete hepatic lesion. No intra or extrahepatic biliary ductal dilatation. Small stones layer within the neck of the gallbladder.  No gallbladder distension or wall thickening. Pancreas: Unremarkable. No pancreatic ductal dilatation or surrounding inflammatory changes. Spleen: Normal in size without focal abnormality. Adrenals/Urinary Tract: Normal adrenal glands. No evidence of nephrolithiasis, hydronephrosis or enhancing renal mass. The ureters and bladder are unremarkable. Stomach/Bowel: Abnormal appearance of the mucosa of the stomach, duodenum and proximal jejunum. There is significant edematous submucosal thickening throughout the segments of bowel. The ileum and colon are relatively spared. No evidence of obstruction. Vascular/Lymphatic: Atherosclerotic calcifications throughout the abdominal aorta. Patent celiac, SMA and IMA without significant stenosis. There are significant stenoses in the right iliac tree. Reproductive: Uterus and bilateral adnexa are unremarkable. Other: Diffuse dependent anasarca. Small amount of fluid present within the abdomen along the pericolic gutters and in the pelvic recesses consistent with ascites. Musculoskeletal: No acute or significant osseous findings. IMPRESSION: 1. Moderate bilateral pleural effusions, trace interstitial pulmonary edema, anasarca, small volume ascites, and diffuse submucosal thickening within the stomach, duodenum and jejunum. In the setting of normal cardiac size, overall findings are consistent with third spacing or other noncardiogenic edema. Given the marked submucosal edema in the stomach and proximal small bowel, a protein losing enteropathy is a consideration, however hypoalbuminemia in and of itself can lead to such an edematous state. Additional infectious and inflammatory processes of the small bowel can also yield a similar appearance for that portion of the findings. 2. Mild centrilobular pulmonary emphysema. 3. Aortic atherosclerosis. 4. Cholelithiasis without evidence of cholecystitis. 5. 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 Does the patient have a clinical history of right lower extremity claudication? Aortic Atherosclerosis (ICD10-I70.0) and Emphysema (ICD10-J43.9). Electronically Signed   By: Malachy Moan M.D.   On: 04/28/2021 07:39        Scheduled Meds:  feeding supplement  237 mL Oral BID BM   folic acid  1 mg Oral Daily   multivitamin with minerals  1 tablet Oral Daily   nicotine  14 mg Transdermal Daily   pantoprazole (PROTONIX) IV  40 mg Intravenous Q12H   sodium bicarbonate  1,300 mg Oral BID   thiamine  100 mg Oral Daily   Or   thiamine  100 mg Intravenous Daily   Continuous Infusions:   LOS: 3 days    Time spent: 32 minutes, more than 50% of time spent on direct patient care.    Marrion Coy, MD [facility]   To contact the attending provider between 7A-7P or the covering provider during after hours 7P-7A, please log into the web site [email or web address] and access using universal [place] password for that web site. If  you do not have the password, please call the hospital operator.  04/28/2021, 12:06 PM

## 2021-04-28 NOTE — NC FL2 (Signed)
[place] MEDICAID FL2 LEVEL OF CARE SCREENING TOOL     IDENTIFICATION  Patient Name: Peggy Brown Birthdate: 31-Jan-1956 Sex: female Admission Date (Current Location): 04/25/2021  [facility] and [place] Number:  Chiropodist and Address:  [facility], [address] [place], [place] [address]      Provider Number: [id number]  Attending Physician Name and Address:  Marrion Coy, MD  Relative Name and Phone Number:  Clydie Braun (sister) [phone]    Current Level of Care: Hospital Recommended Level of Care: Skilled Nursing Facility Prior Approval Number:    Date Approved/Denied:   PASRR Number: [id number]  Discharge Plan: SNF    Current Diagnoses: Patient Active Problem List   Diagnosis Date Noted   Anemia of chronic disease    Thrombocytopenia (HCC)    Acute on chronic blood loss anemia 04/26/2021   Hypotension    Alcohol abuse    Hypokalemia    Hypomagnesemia    SVT (supraventricular tachycardia) (HCC) 04/25/2021   Severe protein-calorie malnutrition (HCC) 04/21/2018   Hyponatremia 04/19/2018    Orientation RESPIRATION BLADDER Height & Weight     Self, Place, Situation  Normal Incontinent, External catheter Weight:   Height:     BEHAVIORAL SYMPTOMS/MOOD NEUROLOGICAL BOWEL NUTRITION STATUS      Incontinent Diet (see discharge summary)  AMBULATORY STATUS COMMUNICATION OF NEEDS Skin   Limited Assist Verbally Normal                       Personal Care Assistance Level of Assistance  Bathing, Feeding, Dressing, Total care Bathing Assistance: Limited assistance Feeding assistance: Independent Dressing Assistance: Limited assistance Total Care Assistance: Limited assistance   Functional Limitations Info  Sight, Hearing, Speech Sight Info: Adequate Hearing Info: Adequate Speech Info: Adequate    SPECIAL CARE FACTORS FREQUENCY  PT (By licensed PT), OT (By licensed OT)     PT Frequency: min 4x weekly OT Frequency:  min 4x weekly            Contractures Contractures Info: Not present    Additional Factors Info  Code Status, Allergies Code Status Info: full Allergies Info: No Known Allergies           Current Medications (04/28/2021):  This is the current hospital active medication list Current Facility-Administered Medications  Medication Dose Route Frequency Provider Last Rate Last Admin   acetaminophen (TYLENOL) tablet 650 mg  650 mg Oral Q6H PRN Acheampong, Genice Rouge, MD       Or   acetaminophen (TYLENOL) suppository 650 mg  650 mg Rectal Q6H PRN Acheampong, Genice Rouge, MD       albuterol (PROVENTIL) (2.5 MG/3ML) 0.083% nebulizer solution 2.5 mg  2.5 mg Nebulization Q2H PRN Acheampong, Genice Rouge, MD       feeding supplement (ENSURE ENLIVE / ENSURE PLUS) liquid 237 mL  237 mL Oral BID BM Marrion Coy, MD   237 mL at 04/28/21 1248   folic acid (FOLVITE) tablet 1 mg  1 mg Oral Daily Acheampong, Genice Rouge, MD   1 mg at 04/28/21 0801   LORazepam (ATIVAN) tablet 1-4 mg  1-4 mg Oral Q1H PRN Lilia Pro, MD       Or   LORazepam (ATIVAN) injection 1-4 mg  1-4 mg Intravenous Q1H PRN Acheampong, Genice Rouge, MD       multivitamin with minerals tablet 1 tablet  1 tablet Oral Daily Acheampong, Genice Rouge, MD   1 tablet at 04/28/21 [id number]  nicotine (NICODERM CQ - dosed in mg/24 hours) patch 14 mg  14 mg Transdermal Daily Acheampong, Genice Rouge, MD   14 mg at 04/28/21 0801   ondansetron (ZOFRAN) tablet 4 mg  4 mg Oral Q6H PRN Acheampong, Genice Rouge, MD       Or   ondansetron [facility] [place] [facility]) injection 4 mg  4 mg Intravenous Q6H PRN Acheampong, Genice Rouge, MD   4 mg at 04/27/21 1173   pantoprazole (PROTONIX) injection 40 mg  40 mg Intravenous Q12H Lilia Pro, MD   40 mg at 04/28/21 0800   phosphorus (K PHOS NEUTRAL) tablet 500 mg  500 mg Oral Q4H Marrion Coy, MD   500 mg at 04/28/21 1541   senna-docusate (Senokot-S) tablet 1 tablet  1 tablet Oral QHS PRN Acheampong, Genice Rouge, MD       sodium bicarbonate tablet 1,300 mg   1,300 mg Oral BID Marrion Coy, MD   1,300 mg at 04/28/21 5670   thiamine tablet 100 mg  100 mg Oral Daily Lilia Pro, MD   100 mg at 04/28/21 1410   Or   thiamine (B-1) injection 100 mg  100 mg Intravenous Daily Acheampong, Genice Rouge, MD         Discharge Medications: Please see discharge summary for a list of discharge medications.  Relevant Imaging Results:  Relevant Lab Results:   Additional Information SSN: [id number]  Gildardo Griffes, LCSW

## 2021-04-28 NOTE — Anesthesia Postprocedure Evaluation (Signed)
Anesthesia Post Note  Patient: Event organiser  Procedure(s) Performed: COLONOSCOPY ESOPHAGOGASTRODUODENOSCOPY (EGD)  Patient location during evaluation: Endoscopy Anesthesia Type: General Level of consciousness: awake and alert Pain management: pain level controlled Vital Signs Assessment: post-procedure vital signs reviewed and stable Respiratory status: spontaneous breathing, nonlabored ventilation, respiratory function stable and patient connected to nasal cannula oxygen Cardiovascular status: blood pressure returned to baseline and stable Postop Assessment: no apparent nausea or vomiting Anesthetic complications: no   No notable events documented.   Last Vitals:  Vitals:   04/28/21 0153 04/28/21 0515  BP: 98/70 103/63  Pulse: 91 81  Resp:  16  Temp: 36.7 C 36.6 C  SpO2: 100% 98%    Last Pain:  Vitals:   04/27/21 2000  TempSrc:   PainSc: 0-No pain                 Cleda Mccreedy Tymber Stallings

## 2021-04-28 NOTE — TOC Progression Note (Signed)
Transition of Care [facility]) - Progression Note    Patient Details  Name: Peggy Brown MRN: [id number] Date of Birth: 03/31/1956  [facility]) CM/SW Contact  Allayne Butcher, RN Phone Number: 04/28/2021, 4:35 PM  Clinical Narrative:    PT is recommending SNF for short term rehab and patient agrees.  She would like [place] go [place] of [place] because that is closest to her family.  She says [facility] would be good with her.  SNF workup started and bed search sent out.     Expected Discharge Plan: Home/Self Care Barriers to Discharge: Continued Medical Work up  Expected Discharge Plan and Services Expected Discharge Plan: Home/Self Care   Discharge Planning Services: CM Consult   Living arrangements for the past 2 months: Single Family Home                 DME Arranged: N/A DME Agency: NA                   Social Determinants of Health (SDOH) Interventions    Readmission Risk Interventions No flowsheet data found.

## 2021-04-28 NOTE — Evaluation (Addendum)
Physical Therapy Evaluation Patient Details Name: Peggy Brown MRN: [id number] DOB: Oct 01, 1956 Today's Date: 04/28/2021   History of Present Illness  65 yo female with onset of poor intake and weakness was admitted, after daughter had removed EtOH available to pt to stop overuse.  Found SVT and hypotension, treated and returned to sinus rhythm.  Had endoscopy and colonoscopy, no findings.  Transfused for anemia, referred to PT.  PMHx: EtOH abuse, tobacco use, SVT  Clinical Impression  Pt was up to side of bed with help, no dizziness or light headed feelings, and assisted to [facility].  Her plan is to work on increased mobility tolerance and to get back home with her family.  Pt is reporting sporadic assistance and recommending SNF, but family may be able to marshall help to get home.  That is not currently expected, but can be checked on with SW/care management.  Follow for acute PT goals as below.    Follow Up Recommendations SNF    Equipment Recommendations  Rolling walker with 5" wheels    Recommendations for Other Services       Precautions / Restrictions Precautions Precautions: Fall Precaution Comments: monitor HR Restrictions Weight Bearing Restrictions: No Other Position/Activity Restrictions: has toe sensitivity but nails are quite long      Mobility  Bed Mobility Overal bed mobility: Needs Assistance Bed Mobility: Rolling;Supine to Sit;Sit to Supine Rolling: Min assist   Supine to sit: Mod assist Sit to supine: Mod assist   General bed mobility comments: mod assist to support trunk and finish scooting out, then mod to get legs back to bed    Transfers Overall transfer level: Needs assistance Equipment used: Rolling walker (2 wheeled);1 person hand held assist Transfers: Sit to/from Stand Sit to Stand: Mod assist            Ambulation/Gait Ambulation/Gait assistance: Min assist Gait Distance (Feet): 8 Feet (4 x 2) Assistive device: Rolling walker (2  wheeled);1 person hand held assist Gait Pattern/deviations: Step-to pattern;Step-through pattern;Decreased stride length Gait velocity: reduced Gait velocity interpretation: <1.8 ft/sec, indicate of risk for recurrent falls General Gait Details: pt is getting up to [facility] and back to bed, declining OOB to chair  Stairs Stairs: Yes          Wheelchair Mobility    Modified Rankin (Stroke Patients Only)       Balance Overall balance assessment: Needs assistance   Sitting balance-Leahy Scale: Fair     Standing balance support: Bilateral upper extremity supported;During functional activity Standing balance-Leahy Scale: Poor                               Pertinent Vitals/Pain Pain Assessment: No/denies pain (sensitive toes from long nails)    Home Living Family/patient expects to be discharged to:: Private residence Living Arrangements: Children Available Help at Discharge: Family;Available PRN/intermittently Type of Home: House Home Access: Level entry     Home Layout: One level Home Equipment: None Additional Comments: pt states she did her own self care and daughter and her sign other do the cooking and cleaning    Prior Function Level of Independence: Independent         Comments: walking with no AD     Hand Dominance   Dominant Hand: Right    Extremity/Trunk Assessment   Upper Extremity Assessment Upper Extremity Assessment: Generalized weakness    Lower Extremity Assessment Lower Extremity Assessment: Generalized weakness  Cervical / Trunk Assessment Cervical / Trunk Assessment: Kyphotic  Communication   Communication: No difficulties  Cognition Arousal/Alertness: Awake/alert Behavior During Therapy: Flat affect Overall Cognitive Status: No family/caregiver present to determine baseline cognitive functioning                                 General Comments: pt is reporting history but not clear if she is accurate       General Comments General comments (skin integrity, edema, etc.): pt is up to [facility] but reports she is home wiht no AD's, family in sporadically as daughter works and her sign other is working overnight and sleeps in the day    Exercises     Assessment/Plan    PT Assessment Patient needs continued PT services  PT Problem List Decreased strength;Decreased activity tolerance;Decreased balance;Decreased mobility;Decreased coordination;Decreased knowledge of use of DME       PT Treatment Interventions DME instruction;Gait training;Functional mobility training;Therapeutic activities;Therapeutic exercise;Balance training;Neuromuscular re-education;Patient/family education    PT Goals (Current goals can be found in the Care Plan section)  Acute Rehab PT Goals Patient Stated Goal: to return home with her daughter PT Goal Formulation: With patient Time For Goal Achievement: 05/12/21 Potential to Achieve Goals: Good    Frequency Min 2X/week   Barriers to discharge Decreased caregiver support home with help sporadically    Co-evaluation               AM-PAC PT "6 Clicks" Mobility  Outcome Measure Help needed turning from your back to your side while in a flat bed without using bedrails?: A Little Help needed moving from lying on your back to sitting on the side of a flat bed without using bedrails?: A Lot Help needed moving to and from a bed to a chair (including a wheelchair)?: A Lot Help needed standing up from a chair using your arms (e.g., wheelchair or bedside chair)?: A Lot Help needed to walk in hospital room?: A Little Help needed climbing 3-5 steps with a railing? : Total 6 Click Score: 13    End of Session Equipment Utilized During Treatment: Gait belt Activity Tolerance: Patient limited by fatigue Patient left: in bed;with call bell/phone within reach;with bed alarm set Nurse Communication: Mobility status PT Visit Diagnosis: Unsteadiness on feet (R26.81);Muscle  weakness (generalized) (M62.81);Difficulty in walking, not elsewhere classified (R26.2)    Time: 3716-9678 PT Time Calculation (min) (ACUTE ONLY): 28 min   Charges:   PT Evaluation $PT Eval Moderate Complexity: 1 Mod PT Treatments $Gait Training: 8-22 mins       Ivar Drape 04/28/2021, 9:54 AM  Samul Dada, PT MS Acute Rehab Dept. Number: [facility] [id number] and [facility] [phone]

## 2021-04-29 DIAGNOSIS — D696 Thrombocytopenia, unspecified: Secondary | ICD-10-CM

## 2021-04-29 DIAGNOSIS — D62 Acute posthemorrhagic anemia: Secondary | ICD-10-CM | POA: Diagnosis not present

## 2021-04-29 DIAGNOSIS — E43 Unspecified severe protein-calorie malnutrition: Secondary | ICD-10-CM | POA: Diagnosis not present

## 2021-04-29 LAB — CBC WITH DIFFERENTIAL/PLATELET
Abs Immature Granulocytes: 0.05 10*3/uL (ref 0.00–0.07)
Basophils Absolute: 0.1 10*3/uL (ref 0.0–0.1)
Basophils Relative: 1 %
Eosinophils Absolute: 0.1 10*3/uL (ref 0.0–0.5)
Eosinophils Relative: 1 %
HCT: 29.6 % — ABNORMAL LOW (ref 36.0–46.0)
Hemoglobin: 10.7 g/dL — ABNORMAL LOW (ref 12.0–15.0)
Immature Granulocytes: 1 %
Lymphocytes Relative: 19 %
Lymphs Abs: 2.1 10*3/uL (ref 0.7–4.0)
MCH: 32.4 pg (ref 26.0–34.0)
MCHC: 36.1 g/dL — ABNORMAL HIGH (ref 30.0–36.0)
MCV: 89.7 fL (ref 80.0–100.0)
Monocytes Absolute: 0.7 10*3/uL (ref 0.1–1.0)
Monocytes Relative: 6 %
Neutro Abs: 8.1 10*3/uL — ABNORMAL HIGH (ref 1.7–7.7)
Neutrophils Relative %: 72 %
Platelets: 132 10*3/uL — ABNORMAL LOW (ref 150–400)
RBC: 3.3 MIL/uL — ABNORMAL LOW (ref 3.87–5.11)
RDW: 15.8 % — ABNORMAL HIGH (ref 11.5–15.5)
WBC: 11.1 10*3/uL — ABNORMAL HIGH (ref 4.0–10.5)
nRBC: 0.2 % (ref 0.0–0.2)

## 2021-04-29 LAB — BASIC METABOLIC PANEL
Anion gap: 7 (ref 5–15)
BUN: 7 mg/dL — ABNORMAL LOW (ref 8–23)
CO2: 21 mmol/L — ABNORMAL LOW (ref 22–32)
Calcium: 7.4 mg/dL — ABNORMAL LOW (ref 8.9–10.3)
Chloride: 104 mmol/L (ref 98–111)
Creatinine, Ser: 0.36 mg/dL — ABNORMAL LOW (ref 0.44–1.00)
GFR, Estimated: >60 mL/min (ref >60)
Glucose, Bld: 77 mg/dL (ref 70–99)
Potassium: 3.8 mmol/L (ref 3.5–5.1)
Sodium: 132 mmol/L — ABNORMAL LOW (ref 135–145)

## 2021-04-29 LAB — SURGICAL PATHOLOGY

## 2021-04-29 LAB — MAGNESIUM: Magnesium: 1.6 mg/dL — ABNORMAL LOW (ref 1.7–2.4)

## 2021-04-29 LAB — ECHOCARDIOGRAM COMPLETE
Area-P 1/2: 3.77 cm2
S' Lateral: 2.54 cm

## 2021-04-29 LAB — PHOSPHORUS: Phosphorus: 3.1 mg/dL (ref 2.5–4.6)

## 2021-04-29 MED ORDER — MAGNESIUM SULFATE 4 GM/100ML IV SOLN
4.0000 g | Freq: Once | INTRAVENOUS | Status: AC
  Administered 2021-04-29: 4 g via INTRAVENOUS
  Filled 2021-04-29: qty 100

## 2021-04-29 NOTE — Progress Notes (Signed)
PROGRESS NOTE    Peggy SparrowCeleste Brown  ZOX:[id number] DOB: [birth year] DOA: 04/25/2021 PCP: Patient, No Pcp Per (Inactive)   Follow-up on acute on chronic anemia. Brief Narrative:  Patient admitted 04/26/2019 2 in the evening with hypotension, SVT and found to be severely anemic.  Hemoglobin was 6.8 on presentation and dropped down to 5.4.  Iron studies more consistent with iron deficiency anemia.  Patient has a history of drinking 2-3 bottles of 40 ounce beer daily.  EGD showed erythema in the stomach, diverticulum of the esophagus and nodule in the esophagus.  Colonoscopy showed a 3 polyps.   Assessment & Plan:   Active Problems:   Severe protein-calorie malnutrition (HCC)   SVT (supraventricular tachycardia) (HCC)   Acute on chronic blood loss anemia   Anemia of chronic disease   Thrombocytopenia (HCC)  #1.  Acute on chronic anemia. Status post transfusion.  Patient does not seem to have any iron deficiency.  Hemoglobin has been stable. EGD showed gastritis and esophagitis.  Colonoscopy showed multiple polyps.  #2.  Severe protein calorie malnutrition. Severe hypoalbuminemia. Anasarca. Anasarca appears to be secondary to severe hypoalbuminemia.  Echocardiogram is also pending.  No evidence of liver cirrhosis. Most likely this is due to alcohol drinking.  Continue protein supplements.  3.  SVT. Hypotension. Condition resolved.  4.  Alcohol abuse. Thrombocytopenia secondary to alcohol. Continue thiamine and folic acid.  No evidence of withdrawal.  5.  Hypomagnesemia Hypokalemia Hypomagnesemia Magnesium level still low, will give 4 g of magnesium sulfate.    DVT prophylaxis: SCDs Code Status: full Family Communication:  Disposition Plan:    Status is: Inpatient  Remains inpatient appropriate because:Unsafe d/c plan  Dispo: The patient is from: Home              Anticipated d/c is to: SNF              Patient currently is medically stable to d/c.   Difficult to  place patient No        I/O last 3 completed shifts: In: 240 [P.O.:240] Out: -  No intake/output data recorded.     Consultants:  None  Procedures: None  Antimicrobials: None   Subjective: Doing much better today, she has good appetite, no abdominal pain or nausea vomiting. Denies any short of breath or cough. No chest pain or palpitation. No dysuria hematuria No fever or chills.  Objective: Vitals:   04/28/21 1946 04/28/21 2356 04/29/21 0430 04/29/21 0728  BP: 117/76 121/71 114/82 113/71  Pulse: 88 87 89 85  Resp: 18 18 16 16   Temp: 98.1 F (36.7 C) 98.3 F (36.8 C) 97.6 F (36.4 C) 98.5 F (36.9 C)  TempSrc:      SpO2: 100% 91% 97% 98%    Intake/Output Summary (Last 24 hours) at 04/29/2021 1016 Last data filed at 04/28/2021 1915 Gross per 24 hour  Intake 240 ml  Output --  Net 240 ml   There were no vitals filed for this visit.  Examination:  General exam: Appears calm and comfortable, appear malnourished Respiratory system: Clear to auscultation. Respiratory effort normal. Cardiovascular system: S1 & S2 heard, RRR. No JVD, murmurs, rubs, gallops or clicks. No pedal edema. Gastrointestinal system: Abdomen is nondistended, soft and nontender. No organomegaly or masses felt. Normal bowel sounds heard. Central nervous system: Alert and oriented x3. No focal neurological deficits. Extremities: Symmetric 5 x 5 power. Skin: No rashes, lesions or ulcers Psychiatry: Judgement and insight appear normal. Mood & affect appropriate.  Data Reviewed: I have personally reviewed following labs and imaging studies  CBC: Recent Labs  Lab 04/25/21 1844 04/25/21 2317 04/26/21 0812 04/26/21 1020 04/26/21 2132 04/27/21 0442 04/28/21 0443 04/29/21 0509  WBC 14.7* 10.4 10.0  --   --  11.0* 13.8* 11.1*  NEUTROABS 11.0*  --   --   --   --   --   --  8.1*  HGB 6.8* 5.4* 11.7* 11.7* 11.5* 10.9* 10.6* 10.7*  HCT 18.2* 14.4* 33.0*  --   --  30.2* 29.5* 29.6*   MCV 91.5 92.9 90.7  --   --  88.3 89.9 89.7  PLT 137* 81* 78*  --   --  97* 123* 132*   Basic Metabolic Panel: Recent Labs  Lab 04/25/21 1844 04/25/21 2317 04/26/21 0812 04/27/21 0442 04/28/21 0443 04/29/21 0509  NA 125* 129*  128* 130* 134* 132* 132*  K 2.8* 3.1* 3.7 3.1* 3.6 3.8  CL 93* 103 104 109 109 104  CO2 23 22 21* 19* 19* 21*  GLUCOSE 132* 101* 76 68* 85 77  BUN 11 9 9  7* 6* 7*  CREATININE 0.63 0.47 0.59 0.43* 0.53 0.36*  CALCIUM 7.7* 6.7* 7.0* 7.3* 7.5* 7.4*  MG 1.9 1.6* 2.3  --  1.6* 1.6*  PHOS 3.1 2.8  --   --  2.3* 3.1   GFR: CrCl cannot be calculated (Unknown ideal weight.). Liver Function Tests: Recent Labs  Lab 04/25/21 1844 04/25/21 2317 04/26/21 0812 04/28/21 0443  AST 32 19 27 28   ALT 12 10 14 14   ALKPHOS 87 69 70 76  BILITOT 1.1 0.9 2.1* 0.9  PROT 5.6* 4.3* 4.9* 4.4*  ALBUMIN 2.1* 1.7* 1.9* 1.6*   No results for input(s): LIPASE, AMYLASE in the last 168 hours. No results for input(s): AMMONIA in the last 168 hours. Coagulation Profile: Recent Labs  Lab 04/26/21 0812  INR 1.1   Cardiac Enzymes: No results for input(s): CKTOTAL, CKMB, CKMBINDEX, TROPONINI in the last 168 hours. BNP (last 3 results) No results for input(s): PROBNP in the last 8760 hours. HbA1C: No results for input(s): HGBA1C in the last 72 hours. CBG: No results for input(s): GLUCAP in the last 168 hours. Lipid Profile: No results for input(s): CHOL, HDL, LDLCALC, TRIG, CHOLHDL, LDLDIRECT in the last 72 hours. Thyroid Function Tests: No results for input(s): TSH, T4TOTAL, FREET4, T3FREE, THYROIDAB in the last 72 hours. Anemia Panel: No results for input(s): VITAMINB12, FOLATE, FERRITIN, TIBC, IRON, RETICCTPCT in the last 72 hours. Sepsis Labs: No results for input(s): PROCALCITON, LATICACIDVEN in the last 168 hours.  Recent Results (from the past 240 hour(s))  Resp Panel by RT-PCR (Flu A&B, Covid) Nasopharyngeal Swab     Status: None   Collection Time: 04/25/21  11:17 PM   Specimen: Nasopharyngeal Swab; Nasopharyngeal(NP) swabs in vial transport medium  Result Value Ref Range Status   SARS Coronavirus 2 by RT PCR NEGATIVE NEGATIVE Final    Comment: (NOTE) SARS-CoV-2 target nucleic acids are NOT DETECTED.  The SARS-CoV-2 RNA is generally detectable in upper respiratory specimens during the acute phase of infection. The lowest concentration of SARS-CoV-2 viral copies this assay can detect is 138 copies/mL. A negative result does not preclude SARS-Cov-2 infection and should not be used as the sole basis for treatment or other patient management decisions. A negative result may occur with  improper specimen collection/handling, submission of specimen other than nasopharyngeal swab, presence of viral mutation(s) within the areas targeted by this assay, and inadequate  number of viral copies(<138 copies/mL). A negative result must be combined with clinical observations, patient history, and epidemiological information. The expected result is Negative.  Fact Sheet for Patients:  BloggerCourse.com  Fact Sheet for Healthcare Providers:  SeriousBroker.it  This test is no t yet approved or cleared by the [place] FDA and  has been authorized for detection and/or diagnosis of SARS-CoV-2 by FDA under an Emergency Use Authorization (EUA). This EUA will remain  in effect (meaning this test can be used) for the duration of the COVID-19 declaration under Section 564(b)(1) of the Act, 21 U.S.C.section 360bbb-3(b)(1), unless the authorization is terminated  or revoked sooner.       Influenza A by PCR NEGATIVE NEGATIVE Final   Influenza B by PCR NEGATIVE NEGATIVE Final    Comment: (NOTE) The Xpert Xpress SARS-CoV-2/FLU/RSV plus assay is intended as an aid in the diagnosis of influenza from Nasopharyngeal swab specimens and should not be used as a sole basis for treatment. Nasal washings and aspirates  are unacceptable for Xpert Xpress SARS-CoV-2/FLU/RSV testing.  Fact Sheet for Patients: BloggerCourse.com  Fact Sheet for Healthcare Providers: SeriousBroker.it  This test is not yet approved or cleared by the [place] FDA and has been authorized for detection and/or diagnosis of SARS-CoV-2 by FDA under an Emergency Use Authorization (EUA). This EUA will remain in effect (meaning this test can be used) for the duration of the COVID-19 declaration under Section 564(b)(1) of the Act, 21 U.S.C. section 360bbb-3(b)(1), unless the authorization is terminated or revoked.  Performed at [facility], [address] [place], [place] [address]          Radiology Studies: CT CHEST ABDOMEN PELVIS W CONTRAST  Result Date: 04/28/2021 CLINICAL DATA:  Unintended weight loss EXAM: CT CHEST, ABDOMEN, AND PELVIS WITH CONTRAST TECHNIQUE: Multidetector CT imaging of the chest, abdomen and pelvis was performed following the standard protocol during bolus administration of intravenous contrast. CONTRAST:  1mL OMNIPAQUE IOHEXOL 300 MG/ML  SOLN COMPARISON:  Abdominal ultrasound 04/26/2021 FINDINGS: CT CHEST FINDINGS Cardiovascular: Conventional 3 vessel arch anatomy. Scattered atherosclerotic calcifications throughout the thoracic aorta. No evidence of aneurysm. The heart is normal in size. No pericardial effusion. Unremarkable size of the main and central pulmonary arteries. Mediastinum/Nodes: Unremarkable CT appearance of the thyroid gland. No suspicious mediastinal or hilar adenopathy. No soft tissue mediastinal mass. The thoracic esophagus is unremarkable. Lungs/Pleura: Small to moderate bilateral layering pleural effusions. Mild interlobular septal thickening. Centrilobular pulmonary emphysema and diffuse mild bronchial wall thickening. Compressive atelectasis in both lower lobes secondary to the presence of the pleural effusions. No focal  airspace infiltrate, pulmonary mass or pulmonary nodule. Musculoskeletal: No acute fracture or aggressive appearing lytic or blastic osseous lesion. CT ABDOMEN PELVIS FINDINGS Hepatobiliary: Normal hepatic contour and morphology. No discrete hepatic lesion. No intra or extrahepatic biliary ductal dilatation. Small stones layer within the neck of the gallbladder. No gallbladder distension or wall thickening. Pancreas: Unremarkable. No pancreatic ductal dilatation or surrounding inflammatory changes. Spleen: Normal in size without focal abnormality. Adrenals/Urinary Tract: Normal adrenal glands. No evidence of nephrolithiasis, hydronephrosis or enhancing renal mass. The ureters and bladder are unremarkable. Stomach/Bowel: Abnormal appearance of the mucosa of the stomach, duodenum and proximal jejunum. There is significant edematous submucosal thickening throughout the segments of bowel. The ileum and colon are relatively spared. No evidence of obstruction. Vascular/Lymphatic: Atherosclerotic calcifications throughout the abdominal aorta. Patent celiac, SMA and IMA without significant stenosis. There are significant stenoses in the right iliac tree. Reproductive: Uterus  and bilateral adnexa are unremarkable. Other: Diffuse dependent anasarca. Small amount of fluid present within the abdomen along the pericolic gutters and in the pelvic recesses consistent with ascites. Musculoskeletal: No acute or significant osseous findings. IMPRESSION: 1. Moderate bilateral pleural effusions, trace interstitial pulmonary edema, anasarca, small volume ascites, and diffuse submucosal thickening within the stomach, duodenum and jejunum. In the setting of normal cardiac size, overall findings are consistent with third spacing or other noncardiogenic edema. Given the marked submucosal edema in the stomach and proximal small bowel, a protein losing enteropathy is a consideration, however hypoalbuminemia in and of itself can lead to such  an edematous state. Additional infectious and inflammatory processes of the small bowel can also yield a similar appearance for that portion of the findings. 2. Mild centrilobular pulmonary emphysema. 3. Aortic atherosclerosis. 4. Cholelithiasis without evidence of cholecystitis. 5. 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 Does the patient have a clinical history of right lower extremity claudication? Aortic Atherosclerosis (ICD10-I70.0) and Emphysema (ICD10-J43.9). Electronically Signed   By: Malachy Moan M.D.   On: 04/28/2021 07:39        Scheduled Meds:  feeding supplement  237 mL Oral BID BM   folic acid  1 mg Oral Daily   multivitamin with minerals  1 tablet Oral Daily   nicotine  14 mg Transdermal Daily   pantoprazole (PROTONIX) IV  40 mg Intravenous Q12H   sodium bicarbonate  1,300 mg Oral BID   thiamine  100 mg Oral Daily   Or   thiamine  100 mg Intravenous Daily   Continuous Infusions:  magnesium sulfate bolus IVPB 4 g (04/29/21 0907)     LOS: 4 days    Time spent: 27 minutes    Marrion Coy, MD [facility]   To contact the attending provider between 7A-7P or the covering provider during after hours 7P-7A, please log into the web site [email or web address] and access using universal [place] password for that web site. If you do not have the password, please call the hospital operator.  04/29/2021, 10:16 AM

## 2021-04-29 NOTE — TOC Progression Note (Addendum)
Transition of Care [facility]) - Progression Note    Patient Details  Name: Peggy Brown MRN: [id number] Date of Birth: Aug 12, 1956  [facility] CM/SW Contact  Allayne Butcher, RN Phone Number: 04/29/2021, 2:19 PM  Clinical Narrative:    [facility] can offer a bed and patient accepts.  [facility] can take her tomorrow.  COVID test to be ordered and collected today.   Attempted to call daughter, [name], who patient lives with but number not valid.     Expected Discharge Plan: Home/Self Care Barriers to Discharge: Continued Medical Work up  Expected Discharge Plan and Services Expected Discharge Plan: Home/Self Care   Discharge Planning Services: CM Consult   Living arrangements for the past 2 months: Single Family Home                 DME Arranged: N/A DME Agency: NA                   Social Determinants of Health (SDOH) Interventions    Readmission Risk Interventions No flowsheet data found.

## 2021-04-30 DIAGNOSIS — D62 Acute posthemorrhagic anemia: Secondary | ICD-10-CM | POA: Diagnosis not present

## 2021-04-30 DIAGNOSIS — F1010 Alcohol abuse, uncomplicated: Secondary | ICD-10-CM

## 2021-04-30 DIAGNOSIS — I471 Supraventricular tachycardia: Secondary | ICD-10-CM | POA: Diagnosis not present

## 2021-04-30 DIAGNOSIS — E43 Unspecified severe protein-calorie malnutrition: Secondary | ICD-10-CM | POA: Diagnosis not present

## 2021-04-30 LAB — BASIC METABOLIC PANEL
Anion gap: 5 (ref 5–15)
BUN: 6 mg/dL — ABNORMAL LOW (ref 8–23)
CO2: 26 mmol/L (ref 22–32)
Calcium: 7.6 mg/dL — ABNORMAL LOW (ref 8.9–10.3)
Chloride: 105 mmol/L (ref 98–111)
Creatinine, Ser: 0.42 mg/dL — ABNORMAL LOW (ref 0.44–1.00)
GFR, Estimated: >60 mL/min (ref >60)
Glucose, Bld: 72 mg/dL (ref 70–99)
Potassium: 3.4 mmol/L — ABNORMAL LOW (ref 3.5–5.1)
Sodium: 136 mmol/L (ref 135–145)

## 2021-04-30 LAB — SARS CORONAVIRUS 2 (TAT 6-24 HRS): SARS Coronavirus 2: NEGATIVE

## 2021-04-30 LAB — MAGNESIUM: Magnesium: 2 mg/dL (ref 1.7–2.4)

## 2021-04-30 MED ORDER — NICOTINE 14 MG/24HR TD PT24: 14.0000 mg | MEDICATED_PATCH | Freq: Every day | TRANSDERMAL | 0 refills | Status: DC

## 2021-04-30 MED ORDER — ENSURE ENLIVE PO LIQD: 237.0000 mL | Freq: Two times a day (BID) | ORAL | 0 refills | Status: AC

## 2021-04-30 MED ORDER — PANTOPRAZOLE SODIUM 40 MG PO TBEC: 40.0000 mg | DELAYED_RELEASE_TABLET | Freq: Two times a day (BID) | ORAL | 1 refills | Status: AC

## 2021-04-30 MED ORDER — POTASSIUM CHLORIDE 20 MEQ PO PACK
40.0000 meq | PACK | Freq: Once | ORAL | Status: AC
  Administered 2021-04-30: 40 meq via ORAL
  Filled 2021-04-30: qty 2

## 2021-04-30 NOTE — Discharge Summary (Addendum)
Physician Discharge Summary  Patient ID: Peggy Brown MRN: [id number] DOB/AGE: Feb 28, 1956 65 y.o.  Admit date: 04/25/2021 Discharge date: 04/30/2021  Admission Diagnoses:  Discharge Diagnoses:  Active Problems:   Severe protein-calorie malnutrition (HCC)   SVT (supraventricular tachycardia) (HCC)   Acute on chronic blood loss anemia   Anemia of chronic disease   Thrombocytopenia (HCC)   Discharged Condition: good  Hospital Course:  Patient admitted 04/26/2019 2 in the evening with hypotension, SVT and found to be severely anemic.  Hemoglobin was 6.8 on presentation and dropped down to 5.4.  Iron studies more consistent with iron deficiency anemia.  Patient has a history of drinking 2-3 bottles of 40 ounce beer daily.  EGD showed erythema in the stomach, diverticulum of the esophagus and nodule in the esophagus.  Colonoscopy showed  polyps.  #1.  Acute on chronic anemia secondary blood loss from gastritis.  Status post transfusion.  Patient does not seem to have any iron deficiency.  Hemoglobin has been stable. EGD showed gastritis and esophagitis.  Colonoscopy showed multiple polyps.  No active bleeding.  Hemoglobin has been stabilized.  #2.  Severe protein calorie malnutrition. Severe hypoalbuminemia. Anasarca. Anasarca appears to be secondary to severe hypoalbuminemia.  Echocardiogram showed normal ejection fraction.  No evidence of liver cirrhosis. Most likely this is due to alcohol drinking.  Continue protein supplements.  3.  SVT. Hypotension. Condition resolved.  4.  Alcohol abuse. Thrombocytopenia secondary to alcohol.  No evidence of withdrawal.  5.  Hypomagnesemia Hypokalemia Hypomagnesemia Repleted, will give additional 40 mg potassium today for potassium of 3.4.  Consults: GI  Significant Diagnostic Studies:  Colon The examined portion of the ileum was normal. Biopsied. - One 11 mm polyp in the cecum, removed piecemeal using a hot snare. Resected and  retrieved. - One 1 mm polyp in the cecum, removed with a jumbo cold forceps. Resected and retrieved. - One 5 mm polyp in the descending colon, removed with a cold snare. Resected and retrieved. - Internal hemorrhoids. - The examination was otherwise normal on direct and retroflexion views. Impression: - Return patient to hospital ward for possible discharge same day. - Advance diet as tolerated. - Continue present medications. - Await pathology results. - Repeat colonoscopy in 6 months for surveillance after piecemeal polypectomy. - Return to GI clinic at appointment to be scheduled.   EGD Diverticulum in the lower third of the esophagus. 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 - Nodule found in the esophagus. Biopsied. - Gastritis. Biopsied. - Normal examined duodenum. Impression: Recommendation: - Perform a colonoscopy today.  CT CHEST, ABDOMEN, AND PELVIS WITH CONTRAST   TECHNIQUE: Multidetector CT imaging of the chest, abdomen and pelvis was performed following the standard protocol during bolus administration of intravenous contrast.   CONTRAST:  71mL OMNIPAQUE IOHEXOL 300 MG/ML  SOLN   COMPARISON:  Abdominal ultrasound 04/26/2021   FINDINGS: CT CHEST FINDINGS   Cardiovascular: Conventional 3 vessel arch anatomy. Scattered atherosclerotic calcifications throughout the thoracic aorta. No evidence of aneurysm. The heart is normal in size. No pericardial effusion. Unremarkable size of the main and central pulmonary arteries.   Mediastinum/Nodes: Unremarkable CT appearance of the thyroid gland. No suspicious mediastinal or hilar adenopathy. No soft tissue mediastinal mass. The thoracic esophagus is unremarkable.   Lungs/Pleura: Small to moderate bilateral layering pleural effusions. Mild interlobular septal thickening. Centrilobular pulmonary emphysema and diffuse mild bronchial wall thickening. Compressive atelectasis in both lower lobes secondary to  the presence of the pleural effusions. No  focal airspace infiltrate, pulmonary mass or pulmonary nodule.   Musculoskeletal: No acute fracture or aggressive appearing lytic or blastic osseous lesion.   CT ABDOMEN PELVIS FINDINGS   Hepatobiliary: Normal hepatic contour and morphology. No discrete hepatic lesion. No intra or extrahepatic biliary ductal dilatation. Small stones layer within the neck of the gallbladder. No gallbladder distension or wall thickening.   Pancreas: Unremarkable. No pancreatic ductal dilatation or surrounding inflammatory changes.   Spleen: Normal in size without focal abnormality.   Adrenals/Urinary Tract: Normal adrenal glands. No evidence of nephrolithiasis, hydronephrosis or enhancing renal mass. The ureters and bladder are unremarkable.   Stomach/Bowel: Abnormal appearance of the mucosa of the stomach, duodenum and proximal jejunum. There is significant edematous submucosal thickening throughout the segments of bowel. The ileum and colon are relatively spared. No evidence of obstruction.   Vascular/Lymphatic: Atherosclerotic calcifications throughout the abdominal aorta. Patent celiac, SMA and IMA without significant stenosis. There are significant stenoses in the right iliac tree.   Reproductive: Uterus and bilateral adnexa are unremarkable.   Other: Diffuse dependent anasarca. Small amount of fluid present within the abdomen along the pericolic gutters and in the pelvic recesses consistent with ascites.   Musculoskeletal: No acute or significant osseous findings.   IMPRESSION: 1. Moderate bilateral pleural effusions, trace interstitial pulmonary edema, anasarca, small volume ascites, and diffuse submucosal thickening within the stomach, duodenum and jejunum. In the setting of normal cardiac size, overall findings are consistent with third spacing or other noncardiogenic edema. Given the marked submucosal edema in the stomach and proximal  small bowel, a protein losing enteropathy is a consideration, however hypoalbuminemia in and of itself can lead to such an edematous state. Additional infectious and inflammatory processes of the small bowel can also yield a similar appearance for that portion of the findings. 2. Mild centrilobular pulmonary emphysema. 3. Aortic atherosclerosis. 4. Cholelithiasis without evidence of cholecystitis. 5. 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 Does the patient have a clinical history of right lower extremity claudication?   Aortic Atherosclerosis (ICD10-I70.0) and Emphysema (ICD10-J43.9).     Electronically Signed   By: Malachy Moan M.D.   On: 04/28/2021 07:39   Echo 1. Left ventricular ejection fraction, by estimation, is 55 %. The left ventricle has normal function. The left ventricle has no regional wall motion abnormalities. Left ventricular diastolic parameters are consistent with Grade I diastolic dysfunction (impaired relaxation). 2. Right ventricular systolic function is normal. The right ventricular size is normal. Tricuspid regurgitation signal is inadequate for assessing PA pressure. 3. The mitral valve is normal in structure. Mild mitral valve regurgitation.  Treatments: PRBC, colon/EGD  Discharge Exam: Blood pressure 112/69, pulse 83, temperature 98 F (36.7 C), resp. rate 16, SpO2 95 %. General appearance: alert and cooperative Resp: clear to auscultation bilaterally Cardio: regular rate and rhythm, S1, S2 normal, no murmur, click, rub or gallop GI: soft, non-tender; bowel sounds normal; no masses,  no organomegaly Extremities: extremities normal, atraumatic, no cyanosis or edema  Disposition: Discharge disposition: 03-Skilled Nursing Facility       Discharge Instructions     Diet - low sodium heart healthy   Complete by: As directed    Increase activity slowly   Complete by: As directed        Allergies as of 04/30/2021   No Known Allergies      Medication List     STOP taking these medications    ibuprofen 200 MG tablet Commonly known as:  ADVIL       TAKE these medications    acetaminophen 500 MG tablet Commonly known as: TYLENOL Take 500-1,000 mg by mouth every 8 (eight) hours as needed for moderate pain.   feeding supplement Liqd Take 237 mLs by mouth 2 (two) times daily between meals.   nicotine 14 mg/24hr patch Commonly known as: NICODERM CQ - dosed in mg/24 hours Place 1 patch (14 mg total) onto the skin daily. Start taking on: May 01, 2021   pantoprazole 40 MG tablet Commonly known as: Protonix Take 1 tablet (40 mg total) by mouth 2 (two) times daily.        Contact information for follow-up providers     Regis Bill, MD Follow up in 2 week(s).   Specialty: Gastroenterology Contact information: [address] [place] [address] [phone]              Contact information for after-discharge care     Destination     [facility] .   Service: Skilled Nursing Contact information: [address] [place] [address] [phone]                    32 minutes Signed: Marrion Coy 04/30/2021, 9:57 AM

## 2021-04-30 NOTE — Progress Notes (Signed)
Peggy Brown to be D/C'd to [name] per MD order.  Discussed with the patient and all questions fully answered.  VSS, Skin clean, dry and intact without evidence of skin break down, no evidence of skin tears noted.  IV catheter discontinued intact. Site without signs and symptoms of complications. Dressing and pressure applied.  An After Visit Summary was printed and given to the patient. Patient received prescription.  D/c education completed with nurse at  [facility] including follow up instructions, medication list, d/c activities limitations if indicated, with other d/c instructions as indicated by MD - patient able to verbalize understanding, all questions fully answered.   Patient instructed to return to ED, call 911, or call MD for any changes in condition.   Patient to be escorted via stretcher, and D/C home via EMS transport.

## 2021-04-30 NOTE — Care Management Important Message (Signed)
Important Message  Patient Details  Name: Donnae Michels MRN: [id number] Date of Birth: January 14, 1956   Medicare Important Message Given:  Yes     Olegario Messier A Hezzie Karim 04/30/2021, 11:49 AM

## 2021-04-30 NOTE — TOC Transition Note (Addendum)
Transition of Care [facility]) - CM/SW Discharge Note   Patient Details  Name: Deberah Adolf MRN: [id number] Date of Birth: September 16, 1956  [facility] CM/SW Contact:  Liliana Cline, LCSW Phone Number: 04/30/2021, 9:59 AM   Clinical Narrative:   Patient to discharge to [facility] today, Room 304. Confirmed with Gavin Pound at [facility]. CSW updated MD, RN, patient, and patient's daughter [name]. Asked RN to call report and MD to submit DC Summary. Medical Necessity Form and Face Sheet completed and printed to unit. Will arrange EMS when RN is ready.  12:00- Nonemergent transport arranged with [facility]. Patient is next on list when a truck is available. Notified RN.   Final next level of care: Skilled Nursing Facility Barriers to Discharge: Barriers Resolved   Patient Goals and CMS Choice Patient states their goals for this hospitalization and ongoing recovery are:: discharge to [facility].gov Compare Post Acute Care list provided to:: Patient Choice offered to / list presented to : Patient, Adult Children  Discharge Placement              Patient chooses bed at: [facility] Patient to be transferred to facility by: EMS Name of family member notified: [name] - daughter Patient and family notified of of transfer: 04/30/21  Discharge Plan and Services   Discharge Planning Services: CM Consult            DME Arranged: N/A DME Agency: NA                  Social Determinants of Health (SDOH) Interventions     Readmission Risk Interventions No flowsheet data found.

## 2021-05-11 ENCOUNTER — Inpatient Hospital Stay
Admission: EM | Admit: 2021-05-11 | Discharge: 2021-05-13 | DRG: 308 | Disposition: A | Discharge status: Skilled Nursing Facility | Payer: Medicare Other | Attending: Internal Medicine | Admitting: Internal Medicine

## 2021-05-11 ENCOUNTER — Other Ambulatory Visit: Payer: Self-pay

## 2021-05-11 ENCOUNTER — Emergency Department: Payer: Medicare Other

## 2021-05-11 ENCOUNTER — Encounter: Payer: Self-pay | Admitting: Emergency Medicine

## 2021-05-11 DIAGNOSIS — I471 Supraventricular tachycardia, unspecified: Secondary | ICD-10-CM | POA: Diagnosis present

## 2021-05-11 DIAGNOSIS — F1020 Alcohol dependence, uncomplicated: Secondary | ICD-10-CM | POA: Diagnosis present

## 2021-05-11 DIAGNOSIS — I4891 Unspecified atrial fibrillation: Secondary | ICD-10-CM | POA: Diagnosis present

## 2021-05-11 DIAGNOSIS — Z20822 Contact with and (suspected) exposure to covid-19: Secondary | ICD-10-CM | POA: Diagnosis present

## 2021-05-11 DIAGNOSIS — E876 Hypokalemia: Secondary | ICD-10-CM | POA: Diagnosis present

## 2021-05-11 DIAGNOSIS — Z681 Body mass index (BMI) 19 or less, adult: Secondary | ICD-10-CM

## 2021-05-11 DIAGNOSIS — E43 Unspecified severe protein-calorie malnutrition: Secondary | ICD-10-CM | POA: Diagnosis present

## 2021-05-11 DIAGNOSIS — J9601 Acute respiratory failure with hypoxia: Secondary | ICD-10-CM | POA: Diagnosis present

## 2021-05-11 DIAGNOSIS — Z79899 Other long term (current) drug therapy: Secondary | ICD-10-CM

## 2021-05-11 DIAGNOSIS — I5031 Acute diastolic (congestive) heart failure: Secondary | ICD-10-CM | POA: Diagnosis present

## 2021-05-11 DIAGNOSIS — F17210 Nicotine dependence, cigarettes, uncomplicated: Secondary | ICD-10-CM | POA: Diagnosis present

## 2021-05-11 DIAGNOSIS — D638 Anemia in other chronic diseases classified elsewhere: Secondary | ICD-10-CM | POA: Diagnosis present

## 2021-05-11 LAB — CBC WITH DIFFERENTIAL/PLATELET
Abs Immature Granulocytes: 0.02 10*3/uL (ref 0.00–0.07)
Basophils Absolute: 0 10*3/uL (ref 0.0–0.1)
Basophils Relative: 0 %
Eosinophils Absolute: 0 10*3/uL (ref 0.0–0.5)
Eosinophils Relative: 0 %
HCT: 28.2 % — ABNORMAL LOW (ref 36.0–46.0)
Hemoglobin: 9.6 g/dL — ABNORMAL LOW (ref 12.0–15.0)
Immature Granulocytes: 0 %
Lymphocytes Relative: 20 %
Lymphs Abs: 1.7 10*3/uL (ref 0.7–4.0)
MCH: 32.8 pg (ref 26.0–34.0)
MCHC: 34 g/dL (ref 30.0–36.0)
MCV: 96.2 fL (ref 80.0–100.0)
Monocytes Absolute: 0.7 10*3/uL (ref 0.1–1.0)
Monocytes Relative: 8 %
Neutro Abs: 6.3 10*3/uL (ref 1.7–7.7)
Neutrophils Relative %: 72 %
Platelets: 289 10*3/uL (ref 150–400)
RBC: 2.93 MIL/uL — ABNORMAL LOW (ref 3.87–5.11)
RDW: 16.9 % — ABNORMAL HIGH (ref 11.5–15.5)
WBC: 8.8 10*3/uL (ref 4.0–10.5)
nRBC: 0 % (ref 0.0–0.2)

## 2021-05-11 LAB — COMPREHENSIVE METABOLIC PANEL
ALT: 16 U/L (ref 0–44)
AST: 30 U/L (ref 15–41)
Albumin: 2.2 g/dL — ABNORMAL LOW (ref 3.5–5.0)
Alkaline Phosphatase: 76 U/L (ref 38–126)
Anion gap: 9 (ref 5–15)
BUN: 13 mg/dL (ref 8–23)
CO2: 28 mmol/L (ref 22–32)
Calcium: 8.4 mg/dL — ABNORMAL LOW (ref 8.9–10.3)
Chloride: 103 mmol/L (ref 98–111)
Creatinine, Ser: 0.85 mg/dL (ref 0.44–1.00)
GFR, Estimated: >60 mL/min (ref >60)
Glucose, Bld: 132 mg/dL — ABNORMAL HIGH (ref 70–99)
Potassium: 3.9 mmol/L (ref 3.5–5.1)
Sodium: 140 mmol/L (ref 135–145)
Total Bilirubin: 0.6 mg/dL (ref 0.3–1.2)
Total Protein: 6.2 g/dL — ABNORMAL LOW (ref 6.5–8.1)

## 2021-05-11 LAB — TSH: TSH: 1.628 u[IU]/mL (ref 0.350–4.500)

## 2021-05-11 LAB — LACTIC ACID, PLASMA
Lactic Acid, Venous: 2.2 mmol/L (ref 0.5–1.9)
Lactic Acid, Venous: 1.4 mmol/L (ref 0.5–1.9)

## 2021-05-11 LAB — TROPONIN I (HIGH SENSITIVITY)
Troponin I (High Sensitivity): 45 ng/L — ABNORMAL HIGH (ref <18)
Troponin I (High Sensitivity): 73 ng/L — ABNORMAL HIGH (ref <18)

## 2021-05-11 LAB — MAGNESIUM: Magnesium: 1.3 mg/dL — ABNORMAL LOW (ref 1.7–2.4)

## 2021-05-11 LAB — PHOSPHORUS: Phosphorus: 3.8 mg/dL (ref 2.5–4.6)

## 2021-05-11 MED ORDER — THIAMINE HCL 100 MG/ML IJ SOLN
100.0000 mg | Freq: Once | INTRAMUSCULAR | Status: AC
  Administered 2021-05-11: 100 mg via INTRAVENOUS
  Filled 2021-05-11: qty 2

## 2021-05-11 MED ORDER — DILTIAZEM HCL 25 MG/5ML IV SOLN: 5.0000 mg | Freq: Once | INTRAVENOUS | Status: AC

## 2021-05-11 MED ORDER — NICOTINE 14 MG/24HR TD PT24
14.0000 mg | MEDICATED_PATCH | Freq: Every day | TRANSDERMAL | Status: DC
  Administered 2021-05-11 – 2021-05-13 (×3): 14 mg via TRANSDERMAL
  Filled 2021-05-11 (×3): qty 1

## 2021-05-11 MED ORDER — ENSURE ENLIVE PO LIQD
237.0000 mL | Freq: Two times a day (BID) | ORAL | Status: DC
  Administered 2021-05-12 – 2021-05-13 (×3): 237 mL via ORAL

## 2021-05-11 MED ORDER — ADENOSINE 12 MG/4ML IV SOLN
INTRAVENOUS | Status: AC
  Filled 2021-05-11: qty 4

## 2021-05-11 MED ORDER — SODIUM CHLORIDE 0.9% FLUSH
3.0000 mL | Freq: Two times a day (BID) | INTRAVENOUS | Status: DC
  Administered 2021-05-11 – 2021-05-13 (×4): 3 mL via INTRAVENOUS

## 2021-05-11 MED ORDER — MAGNESIUM SULFATE 2 GM/50ML IV SOLN
2.0000 g | Freq: Once | INTRAVENOUS | Status: AC
  Administered 2021-05-11: 2 g via INTRAVENOUS
  Filled 2021-05-11: qty 50

## 2021-05-11 MED ORDER — SODIUM CHLORIDE 0.9% FLUSH: 3.0000 mL | INTRAVENOUS | Status: DC | PRN

## 2021-05-11 MED ORDER — MAGNESIUM SULFATE 2 GM/50ML IV SOLN
INTRAVENOUS | Status: AC
  Administered 2021-05-11: 2 g via INTRAVENOUS
  Filled 2021-05-11: qty 50

## 2021-05-11 MED ORDER — CALCIUM GLUCONATE 10 % IV SOLN: 1.0000 g | Freq: Once | INTRAVENOUS | Status: AC

## 2021-05-11 MED ORDER — FUROSEMIDE 10 MG/ML IJ SOLN
20.0000 mg | Freq: Every day | INTRAMUSCULAR | Status: DC
  Administered 2021-05-11: 20 mg via INTRAVENOUS
  Filled 2021-05-11 (×2): qty 4

## 2021-05-11 MED ORDER — ACETAMINOPHEN 500 MG PO TABS: 1000.0000 mg | ORAL_TABLET | Freq: Three times a day (TID) | ORAL | Status: DC | PRN

## 2021-05-11 MED ORDER — ADENOSINE 6 MG/2ML IV SOLN
INTRAVENOUS | Status: AC
  Filled 2021-05-11: qty 2

## 2021-05-11 MED ORDER — CALCIUM GLUCONATE 10 % IV SOLN
INTRAVENOUS | Status: AC
  Administered 2021-05-11: 1 g via INTRAVENOUS
  Filled 2021-05-11: qty 10

## 2021-05-11 MED ORDER — PANTOPRAZOLE SODIUM 40 MG PO TBEC
40.0000 mg | DELAYED_RELEASE_TABLET | Freq: Two times a day (BID) | ORAL | Status: DC
  Administered 2021-05-11 – 2021-05-13 (×4): 40 mg via ORAL
  Filled 2021-05-11 (×4): qty 1

## 2021-05-11 MED ORDER — MAGNESIUM SULFATE 2 GM/50ML IV SOLN: 2.0000 g | Freq: Once | INTRAVENOUS | Status: AC

## 2021-05-11 MED ORDER — ONDANSETRON HCL 4 MG/2ML IJ SOLN: 4.0000 mg | Freq: Four times a day (QID) | INTRAMUSCULAR | Status: DC | PRN

## 2021-05-11 MED ORDER — DILTIAZEM HCL 25 MG/5ML IV SOLN
INTRAVENOUS | Status: AC
  Administered 2021-05-11: 5 mg via INTRAVENOUS
  Filled 2021-05-11: qty 5

## 2021-05-11 MED ORDER — METOPROLOL SUCCINATE ER 25 MG PO TB24
25.0000 mg | ORAL_TABLET | Freq: Every day | ORAL | Status: DC
  Administered 2021-05-11 – 2021-05-13 (×3): 25 mg via ORAL
  Filled 2021-05-11 (×3): qty 1

## 2021-05-11 MED ORDER — ENOXAPARIN SODIUM 40 MG/0.4ML IJ SOSY
40.0000 mg | PREFILLED_SYRINGE | INTRAMUSCULAR | Status: DC
  Administered 2021-05-11 – 2021-05-12 (×2): 40 mg via SUBCUTANEOUS
  Filled 2021-05-11 (×2): qty 0.4

## 2021-05-11 MED ORDER — SODIUM CHLORIDE 0.9 % IV SOLN: 250.0000 mL | INTRAVENOUS | Status: DC | PRN

## 2021-05-11 MED ORDER — GUAIFENESIN 100 MG/5ML PO SOLN
200.0000 mg | Freq: Four times a day (QID) | ORAL | Status: DC | PRN
  Filled 2021-05-11: qty 10

## 2021-05-11 NOTE — ED Triage Notes (Signed)
Pt ems from [facility] via ems for sob. Ems noted pt with hr 240's and pt having possible seizure.

## 2021-05-11 NOTE — ED Notes (Signed)
Purewick placed due to patient's Furosemide administration and since patient is non-ambulatory. When placing Purewick patient mentioned pain on sacral area and checked - skin assessment noted regarding Stage 1 pressure ulcer on sacrum. Prophylactic dressing placed. Per pt no cream or anything used.

## 2021-05-11 NOTE — ED Notes (Signed)
Patient eating diet on own and sitting up in bed. Patient is drinking well also.

## 2021-05-11 NOTE — ED Notes (Signed)
Purewick placed for urine collection due to patient's furosemide administration.

## 2021-05-11 NOTE — H&P (Signed)
History and Physical    Peggy Brown MGQ:[id number] DOB: 06-15-1956 DOA: 05/11/2021  PCP: Patient, No Pcp Per (Inactive)   Patient coming from: [facility]  I have personally briefly reviewed patient's old medical records in [facility]  Chief Complaint: Shortness of breath  HPI: Peggy Brown is a 65 y.o. female with medical history significant for severe protein calorie malnutrition, history of alcohol abuse, recent hospitalization for anemia requiring blood transfusion who was brought into the ER by EMS for evaluation of shortness of breath and palpitations.  Per EMS patient was tachycardic with heart rate in the 240s. She complains of palpitations, dizziness, lightheadedness, shortness of breath mostly with exertion and generalized weakness.  She also has bilateral lower extremity swelling but denies orthopnea or paroxysmal nocturnal dyspnea. She denies having any chest pain, no nausea, no vomiting, no diarrhea, no constipation, no urinary frequency, no nocturia, no dysuria, no hematuria, no headache, no blurred vision no focal deficit. Labs show sodium 140, potassium 3.9, chloride 103, bicarb 28, glucose 132, BUN 13, creatinine 0.85, calcium 8.4, phosphorus 3.8, magnesium 1.3, alkaline phosphatase 76, albumin 2.2, AST 30, ALT 16, total protein 6.2, troponin 45, lactic acid 2.2, white count 8.8, hemoglobin 9.6, hematocrit 28.2, MCV 96.2, RDW 16.9, platelet count 289, TSH 1.6 Chest x-ray reviewed by me shows mild bilateral pleural effusions.  Left greater than right Twelve-lead EKG upon arrival to the ER showed a narrow complex tachycardia consistent with SVT Repeat twelve-lead EKG following administration of adenosine showed sinus tachycardia  ED Course: Patient is a 66 year old female who was sent to the ER for evaluation of shortness of breath and palpitations.  She was noted to be in SVT and received a dose of adenosine 6 mg IV push in the ER.  Patient converted to atrial  fibrillation transiently and then received Cardizem 5 mg IV push and then converted to sinus rhythm. She received IV magnesium and calcium in the ER Imaging shows bilateral pleural effusions. She will be referred to observation status for further evaluation.     Review of Systems: As per HPI otherwise all other systems reviewed and negative.    History reviewed. No pertinent past medical history.  Past Surgical History:  Procedure Laterality Date   CESAREAN SECTION     COLONOSCOPY N/A 04/27/2021   Procedure: COLONOSCOPY;  Surgeon: Regis Bill, MD;  Location: [facility] ENDOSCOPY;  Service: Endoscopy;  Laterality: N/A;   ESOPHAGOGASTRODUODENOSCOPY N/A 04/27/2021   Procedure: ESOPHAGOGASTRODUODENOSCOPY (EGD);  Surgeon: Regis Bill, MD;  Location: [facility] ENDOSCOPY;  Service: Endoscopy;  Laterality: N/A;     reports that she has been smoking cigarettes. She has a 22.50 pack-year smoking history. She has never used smokeless tobacco. She reports current alcohol use of about 2.0 standard drinks of alcohol per week. She reports that she does not use drugs.  No Known Allergies  Family History  Problem Relation Age of Onset   Hypertension Mother    Thyroid cancer Mother    COPD Father       Prior to Admission medications   Medication Sig Start Date End Date Taking? Authorizing Provider  acetaminophen (TYLENOL) 500 MG tablet Take 1,000 mg by mouth every 8 (eight) hours as needed for moderate pain or mild pain.   Yes [provider]  feeding supplement (ENSURE ENLIVE / ENSURE PLUS) LIQD Take 237 mLs by mouth 2 (two) times daily between meals. 04/30/21  Yes Marrion Coy, MD  furosemide (LASIX) 20 MG tablet Take 80  mg by mouth daily. 05/08/21 05/13/21 Yes [provider]  guaifenesin (ROBITUSSIN) 100 MG/5ML syrup Take 200 mg by mouth every 6 (six) hours as needed for cough.   Yes [provider]  nicotine (NICODERM CQ - DOSED IN MG/24 HOURS) 14 mg/24hr patch  Place 1 patch (14 mg total) onto the skin daily. 05/01/21  Yes Marrion Coy, MD  pantoprazole (PROTONIX) 40 MG tablet Take 1 tablet (40 mg total) by mouth 2 (two) times daily. 04/30/21 05/30/21 Yes Marrion Coy, MD  potassium chloride (KLOR-CON) 10 MEQ tablet Take 40 mEq by mouth daily. 05/13/21 05/18/21 Yes [provider]    Physical Exam: Vitals:   05/11/21 1215 05/11/21 1230 05/11/21 1245 05/11/21 1300  BP: 116/67 123/78 127/79 128/79  Pulse: (!) 112 (!) 114 (!) 106 (!) 101  Resp: 18 (!) 29 20 15   Temp:      TempSrc:      SpO2: 99% 94% 94% 100%  Weight:      Height:         Vitals:   05/11/21 1215 05/11/21 1230 05/11/21 1245 05/11/21 1300  BP: 116/67 123/78 127/79 128/79  Pulse: (!) 112 (!) 114 (!) 106 (!) 101  Resp: 18 (!) 29 20 15   Temp:      TempSrc:      SpO2: 99% 94% 94% 100%  Weight:      Height:          Constitutional: Alert and oriented x 3 . Not in any apparent distress.  Thin and frail HEENT:      Head: Normocephalic and atraumatic.         Eyes: PERLA, EOMI, Conjunctivae pallor. Sclera is non-icteric.       Mouth/Throat: Mucous membranes are moist.       Neck: Supple with no signs of meningismus. Cardiovascular: Tachycardic. No murmurs, gallops, or rubs. 2+ symmetrical distal pulses are present . No JVD. No 2+ LE edema Respiratory: Respiratory effort normal .crackles at both bases bilaterally, No wheezes or rhonchi.  Gastrointestinal: Soft, non tender, and non distended with positive bowel sounds.  Genitourinary: No CVA tenderness. Musculoskeletal: Nontender with normal range of motion in all extremities. No cyanosis, or erythema of extremities. Neurologic:  Face is symmetric. Moving all extremities. No gross focal neurologic deficits . Skin: Skin is warm, dry.  No rash or ulcers Psychiatric: Mood and affect are normal    Labs on Admission: I have personally reviewed following labs and imaging studies  CBC: Recent Labs  Lab 05/11/21 1131  WBC  8.8  NEUTROABS 6.3  HGB 9.6*  HCT 28.2*  MCV 96.2  PLT 289   Basic Metabolic Panel: Recent Labs  Lab 05/11/21 1131  NA 140  K 3.9  CL 103  CO2 28  GLUCOSE 132*  BUN 13  CREATININE 0.85  CALCIUM 8.4*  MG 1.3*  PHOS 3.8   GFR: Estimated Creatinine Clearance: 48.9 mL/min (by C-G formula based on SCr of 0.85 mg/dL). Liver Function Tests: Recent Labs  Lab 05/11/21 1131  AST 30  ALT 16  ALKPHOS 76  BILITOT 0.6  PROT 6.2*  ALBUMIN 2.2*   No results for input(s): LIPASE, AMYLASE in the last 168 hours. No results for input(s): AMMONIA in the last 168 hours. Coagulation Profile: No results for input(s): INR, PROTIME in the last 168 hours. Cardiac Enzymes: No results for input(s): CKTOTAL, CKMB, CKMBINDEX, TROPONINI in the last 168 hours. BNP (last 3 results) No results for input(s): PROBNP in the  last 8760 hours. HbA1C: No results for input(s): HGBA1C in the last 72 hours. CBG: No results for input(s): GLUCAP in the last 168 hours. Lipid Profile: No results for input(s): CHOL, HDL, LDLCALC, TRIG, CHOLHDL, LDLDIRECT in the last 72 hours. Thyroid Function Tests: Recent Labs    05/11/21 1131  TSH 1.628   Anemia Panel: No results for input(s): VITAMINB12, FOLATE, FERRITIN, TIBC, IRON, RETICCTPCT in the last 72 hours. Urine analysis:    Component Value Date/Time   COLORURINE AMBER (A) 10/21/2019 1447   APPEARANCEUR CLOUDY (A) 10/21/2019 1447   APPEARANCEUR Clear 06/17/2014 1115   LABSPEC 1.021 10/21/2019 1447   LABSPEC 1.006 06/17/2014 1115   PHURINE 5.0 10/21/2019 1447   GLUCOSEU 50 (A) 10/21/2019 1447   GLUCOSEU Negative 06/17/2014 1115   HGBUR NEGATIVE 10/21/2019 1447   BILIRUBINUR NEGATIVE 10/21/2019 1447   BILIRUBINUR Negative 06/17/2014 1115   KETONESUR 5 (A) 10/21/2019 1447   PROTEINUR 30 (A) 10/21/2019 1447   NITRITE NEGATIVE 10/21/2019 1447   LEUKOCYTESUR TRACE (A) 10/21/2019 1447   LEUKOCYTESUR Negative 06/17/2014 1115    Radiological Exams on  Admission: DG Chest Portable 1 View  Result Date: 05/11/2021 CLINICAL DATA:  Shortness of breath. EXAM: PORTABLE CHEST 1 VIEW COMPARISON:  April 25, 2021. FINDINGS: The heart size and mediastinal contours are within normal limits. No pneumothorax is noted. Mild bilateral pleural effusions are noted with associated subsegmental atelectasis, left greater than right. The visualized skeletal structures are unremarkable. IMPRESSION: Mild bilateral pleural effusions are noted with associated subsegmental atelectasis, left greater than right. Electronically Signed   By: Lupita Raider M.D.   On: 05/11/2021 12:41     Assessment/Plan Principal Problem:   SVT (supraventricular tachycardia) (HCC) Active Problems:   Severe protein-calorie malnutrition (HCC)   Hypomagnesemia   Anemia of chronic disease   Acute diastolic CHF (congestive heart failure) (HCC)      SVT Patient presents to the emergency room for evaluation of shortness of breath and palpitations and was found to have a narrow complex tachycardia She received a dose of adenosine 6 mg in the ER and converted transiently to rapid atrial fibrillation. She received a dose of Cardizem 5 mg IV push and is currently in sinus tachycardia Patient had an episode of SVT during her last hospitalization and will probably need to be discharged on a chronotropic agent We will consult cardiology Supplement electrolytes Check TSH levels      Acute diastolic dysfunction CHF Patient's last known LVEF was 55% from July 2020 She presents for evaluation of shortness of breath and has bilateral lower extremity swelling Chest x-ray shows bilateral pleural effusions We will start patient on furosemide 20 mg daily and a low-dose beta-blocker    Hypomagnesemia Supplement magnesium    Severe protein calorie malnutrition Continue low-sodium diet Add nutritional supplements    Anemia of chronic disease H&H is stable  DVT prophylaxis: Lovenox   Code Status: full code  Family Communication: Greater than 50% of time was spent discussing patient's condition and plan of care with her at the bedside.  All questions and concerns have been addressed.  She verbalizes understanding and agrees with the plan.  CODE STATUS was discussed and she wishes to be full code. Disposition Plan: Back to previous home environment Consults called: Cardiology Status: Observation    Bill Yohn MD [facility]     05/11/2021, 2:54 PM

## 2021-05-11 NOTE — Consult Note (Signed)
Cardiology Consultation Note    Patient ID: Peggy Brown, MRN: [id number], DOB/AGE: 1955/10/04 65 y.o. Admit date: 05/11/2021   Date of Consult: 05/11/2021 Primary Physician: Patient, No Pcp Per (Inactive) Primary Cardiologist: None  Chief Complaint: SOB Reason for Consultation: svt Requesting MD: Dr. Joylene Igo  HPI: Chantelle Verdi is a 65 y.o. female with history of SVT, anemia, thrombocytopenia, malnutrition who was brought to the emergency room with apparent complaints of shortness of breath and possible seizure.  Patient is not able to give history.  She apparently also had about 24-hour history of generalized weakness and fatigue.  She has a history of chronic alcoholism and on arrival was in respiratory distress with extremely rapid narrow complex tachycardia in the 200+ range.  At the time of my exam she was resting comfortably.  Heart rate was in the 90s.  She was not able to give any significant history.  She apparently is on furosemide 80 mg daily, potassium, pantoprazole and Ensure supplements.  In the emergency room she was given Cardizem IV with resultant conversion to sinus tachycardia.  Her magnesium was 1.3.  Chest x-ray revealed small bilateral pleural effusions.  High-sensitivity troponin was 45.  Creatinine was 0.85.  Potassium was 3.9.  TSH is 1.628.  Magnesium is 1.3.  Echocardiogram done at previous visit last month was read as showing an EF of 55% with no significant valvular abnormalities.    History reviewed. No pertinent past medical history.    Surgical History:  Past Surgical History:  Procedure Laterality Date   CESAREAN SECTION     COLONOSCOPY N/A 04/27/2021   Procedure: COLONOSCOPY;  Surgeon: Regis Bill, MD;  Location: [facility] ENDOSCOPY;  Service: Endoscopy;  Laterality: N/A;   ESOPHAGOGASTRODUODENOSCOPY N/A 04/27/2021   Procedure: ESOPHAGOGASTRODUODENOSCOPY (EGD);  Surgeon: Regis Bill, MD;  Location: [facility] ENDOSCOPY;  Service: Endoscopy;   Laterality: N/A;     Home Meds: Prior to Admission medications   Medication Sig Start Date End Date Taking? Authorizing Provider  acetaminophen (TYLENOL) 500 MG tablet Take 1,000 mg by mouth every 8 (eight) hours as needed for moderate pain or mild pain.   Yes [provider]  feeding supplement (ENSURE ENLIVE / ENSURE PLUS) LIQD Take 237 mLs by mouth 2 (two) times daily between meals. 04/30/21  Yes Marrion Coy, MD  furosemide (LASIX) 20 MG tablet Take 80 mg by mouth daily. 05/08/21 05/13/21 Yes [provider]  guaifenesin (ROBITUSSIN) 100 MG/5ML syrup Take 200 mg by mouth every 6 (six) hours as needed for cough.   Yes [provider]  nicotine (NICODERM CQ - DOSED IN MG/24 HOURS) 14 mg/24hr patch Place 1 patch (14 mg total) onto the skin daily. 05/01/21  Yes Marrion Coy, MD  pantoprazole (PROTONIX) 40 MG tablet Take 1 tablet (40 mg total) by mouth 2 (two) times daily. 04/30/21 05/30/21 Yes Marrion Coy, MD  potassium chloride (KLOR-CON) 10 MEQ tablet Take 40 mEq by mouth daily. 05/13/21 05/18/21 Yes [provider]    Inpatient Medications:   adenosine       enoxaparin (LOVENOX) injection  40 mg Subcutaneous Q24H   feeding supplement  237 mL Oral BID BM   furosemide  20 mg Intravenous Daily   metoprolol succinate  25 mg Oral Daily   nicotine  14 mg Transdermal Daily   pantoprazole  40 mg Oral BID   sodium chloride flush  3 mL Intravenous Q12H    sodium chloride     magnesium sulfate bolus IVPB  2 g (05/11/21 1627)    Allergies: No Known Allergies  Social History   Socioeconomic History   Marital status: Single    Spouse name: Not on file   Number of children: Not on file   Years of education: Not on file   Highest education level: Not on file  Occupational History   Not on file  Tobacco Use   Smoking status: Every Day    Packs/day: 0.50    Years: 45.00    Pack years: 22.50    Types: Cigarettes   Smokeless tobacco: Never  Substance and  Sexual Activity   Alcohol use: Yes    Alcohol/week: 2.0 standard drinks    Types: 2 Cans of beer per week   Drug use: Never   Sexual activity: Never  Other Topics Concern   Not on file  Social History Narrative   Not on file   Social Determinants of Health   Financial Resource Strain: Not on file  Food Insecurity: Not on file  Transportation Needs: Not on file  Physical Activity: Not on file  Stress: Not on file  Social Connections: Not on file  Intimate Partner Violence: Not on file     Family History  Problem Relation Age of Onset   Hypertension Mother    Thyroid cancer Mother    COPD Father      Review of Systems: A 12-system review of systems was performed and is negative except as noted in the HPI.  Labs: No results for input(s): CKTOTAL, CKMB, TROPONINI in the last 72 hours. Lab Results  Component Value Date   WBC 8.8 05/11/2021   HGB 9.6 (L) 05/11/2021   HCT 28.2 (L) 05/11/2021   MCV 96.2 05/11/2021   PLT 289 05/11/2021    Recent Labs  Lab 05/11/21 1131  NA 140  K 3.9  CL 103  CO2 28  BUN 13  CREATININE 0.85  CALCIUM 8.4*  PROT 6.2*  BILITOT 0.6  ALKPHOS 76  ALT 16  AST 30  GLUCOSE 132*   No results found for: CHOL, HDL, LDLCALC, TRIG No results found for: DDIMER  Radiology/Studies:  US Abdomen Complete  Result Date: 04/26/2021 CLINICAL DATA:  Elevated liver enzymes, thrombocytopenia EXAM: ABDOMEN ULTRASOUND COMPLETE COMPARISON:  None. FINDINGS: Gallbladder: There is sludge and small stones within a nondilated gallbladder. There is no wall thickening or pericholecystic fluid. Common bile duct: Diameter: 2.0 mm Liver: No focal lesion identified. Within normal limits in parenchymal echogenicity. Portal vein is patent on color Doppler imaging with normal direction of blood flow towards the liver. IVC: No abnormality visualized. Pancreas: Visualized portion unremarkable. Spleen: Size and appearance within normal limits. Right Kidney: Length: 10.8  cm.  No hydronephrosis or visible mass. Left Kidney: Length: 8.9 cm.  No hydronephrosis or visible mass. Abdominal aorta: Portions obscured by bowel gas. Some atherosclerotic disease noted. No aneurysm. Other findings: Right pleural effusion. IMPRESSION: Sludge and small stones within the gallbladder. No evidence of acute cholecystitis or biliary obstruction. Right pleural effusion. Electronically Signed   By: Caprice Renshaw   On: 04/26/2021 18:55   CT CHEST ABDOMEN PELVIS W CONTRAST  Result Date: 04/28/2021 CLINICAL DATA:  Unintended weight loss EXAM: CT CHEST, ABDOMEN, AND PELVIS WITH CONTRAST TECHNIQUE: Multidetector CT imaging of the chest, abdomen and pelvis was performed following the standard protocol during bolus administration of intravenous contrast. CONTRAST:  75mL OMNIPAQUE IOHEXOL 300 MG/ML  SOLN COMPARISON:  Abdominal ultrasound 04/26/2021 FINDINGS: CT CHEST FINDINGS Cardiovascular: Conventional 3  vessel arch anatomy. Scattered atherosclerotic calcifications throughout the thoracic aorta. No evidence of aneurysm. The heart is normal in size. No pericardial effusion. Unremarkable size of the main and central pulmonary arteries. Mediastinum/Nodes: Unremarkable CT appearance of the thyroid gland. No suspicious mediastinal or hilar adenopathy. No soft tissue mediastinal mass. The thoracic esophagus is unremarkable. Lungs/Pleura: Small to moderate bilateral layering pleural effusions. Mild interlobular septal thickening. Centrilobular pulmonary emphysema and diffuse mild bronchial wall thickening. Compressive atelectasis in both lower lobes secondary to the presence of the pleural effusions. No focal airspace infiltrate, pulmonary mass or pulmonary nodule. Musculoskeletal: No acute fracture or aggressive appearing lytic or blastic osseous lesion. CT ABDOMEN PELVIS FINDINGS Hepatobiliary: Normal hepatic contour and morphology. No discrete hepatic lesion. No intra or extrahepatic biliary ductal dilatation.  Small stones layer within the neck of the gallbladder. No gallbladder distension or wall thickening. Pancreas: Unremarkable. No pancreatic ductal dilatation or surrounding inflammatory changes. Spleen: Normal in size without focal abnormality. Adrenals/Urinary Tract: Normal adrenal glands. No evidence of nephrolithiasis, hydronephrosis or enhancing renal mass. The ureters and bladder are unremarkable. Stomach/Bowel: Abnormal appearance of the mucosa of the stomach, duodenum and proximal jejunum. There is significant edematous submucosal thickening throughout the segments of bowel. The ileum and colon are relatively spared. No evidence of obstruction. Vascular/Lymphatic: Atherosclerotic calcifications throughout the abdominal aorta. Patent celiac, SMA and IMA without significant stenosis. There are significant stenoses in the right iliac tree. Reproductive: Uterus and bilateral adnexa are unremarkable. Other: Diffuse dependent anasarca. Small amount of fluid present within the abdomen along the pericolic gutters and in the pelvic recesses consistent with ascites. Musculoskeletal: No acute or significant osseous findings. IMPRESSION: 1. Moderate bilateral pleural effusions, trace interstitial pulmonary edema, anasarca, small volume ascites, and diffuse submucosal thickening within the stomach, duodenum and jejunum. In the setting of normal cardiac size, overall findings are consistent with third spacing or other noncardiogenic edema. Given the marked submucosal edema in the stomach and proximal small bowel, a protein losing enteropathy is a consideration, however hypoalbuminemia in and of itself can lead to such an edematous state. Additional infectious and inflammatory processes of the small bowel can also yield a similar appearance for that portion of the findings. 2. Mild centrilobular pulmonary emphysema. 3. Aortic atherosclerosis. 4. Cholelithiasis without evidence of cholecystitis. 5. Probable hemodynamically  significant stenosis of the right common and external iliac arteries. Does the patient have a clinical history of right lower extremity claudication? Aortic Atherosclerosis (ICD10-I70.0) and Emphysema (ICD10-J43.9). Electronically Signed   By: Malachy Moan M.D.   On: 04/28/2021 07:39   DG Chest Portable 1 View  Result Date: 05/11/2021 CLINICAL DATA:  Shortness of breath. EXAM: PORTABLE CHEST 1 VIEW COMPARISON:  April 25, 2021. FINDINGS: The heart size and mediastinal contours are within normal limits. No pneumothorax is noted. Mild bilateral pleural effusions are noted with associated subsegmental atelectasis, left greater than right. The visualized skeletal structures are unremarkable. IMPRESSION: Mild bilateral pleural effusions are noted with associated subsegmental atelectasis, left greater than right. Electronically Signed   By: Lupita Raider M.D.   On: 05/11/2021 12:41   DG Chest Portable 1 View  Result Date: 04/25/2021 CLINICAL DATA:  Pt states weakness x few days. Denies vomiting, diarrhea, fever. Pt denies pain. Speech clear. Moving all extremities. Pt states eyes blurry bilaterally. HR 200 in triage. EXAM: PORTABLE CHEST 1 VIEW COMPARISON:  04/19/2018 FINDINGS: Cardiac silhouette is normal in size. No mediastinal or hilar masses. Clear lungs.  No pleural effusion or pneumothorax. Skeletal  structures are grossly intact. IMPRESSION: No active disease. Electronically Signed   By: Amie Portland M.D.   On: 04/25/2021 19:30   ECHOCARDIOGRAM COMPLETE  Result Date: 04/29/2021    ECHOCARDIOGRAM REPORT   Patient Name:   AMBERLI RUEGG Date of Exam: 04/28/2021 Medical Rec #:  [id number]        Height:       61.0 in Accession #:    [id number]       Weight:       110.0 lb Date of Birth:  [birth year]        BSA:          1.465 m Patient Age:    64 years         BP:           117/76 mmHg Patient Gender: F                HR:           84 bpm. Exam Location:  [facility] Procedure: 2D Echo, Cardiac Doppler and  Color Doppler Indications:     I50.31 Acute diastolic congestive heart failure  History:         Patient has no prior history of Echocardiogram examinations.  Sonographer:     Sedonia Small Rodgers-Jones Referring Phys:  [id number] Marrion Coy Diagnosing Phys: Julien Nordmann MD IMPRESSIONS  1. Left ventricular ejection fraction, by estimation, is 55 %. The left ventricle has normal function. The left ventricle has no regional wall motion abnormalities. Left ventricular diastolic parameters are consistent with Grade I diastolic dysfunction (impaired relaxation).  2. Right ventricular systolic function is normal. The right ventricular size is normal. Tricuspid regurgitation signal is inadequate for assessing PA pressure.  3. The mitral valve is normal in structure. Mild mitral valve regurgitation. FINDINGS  Left Ventricle: Left ventricular ejection fraction, by estimation, is 55 %. The left ventricle has normal function. The left ventricle has no regional wall motion abnormalities. The left ventricular internal cavity size was normal in size. There is no left ventricular hypertrophy. Left ventricular diastolic parameters are consistent with Grade I diastolic dysfunction (impaired relaxation). Right Ventricle: The right ventricular size is normal. No increase in right ventricular wall thickness. Right ventricular systolic function is normal. Tricuspid regurgitation signal is inadequate for assessing PA pressure. Left Atrium: Left atrial size was normal in size. Right Atrium: Right atrial size was normal in size. Pericardium: There is no evidence of pericardial effusion. Mitral Valve: The mitral valve is normal in structure. Mild mitral valve regurgitation. No evidence of mitral valve stenosis. Tricuspid Valve: The tricuspid valve is normal in structure. Tricuspid valve regurgitation is not demonstrated. No evidence of tricuspid stenosis. Aortic Valve: The aortic valve is normal in structure. Aortic valve regurgitation is not  visualized. Mild to moderate aortic valve sclerosis/calcification is present, without any evidence of aortic stenosis. Pulmonic Valve: The pulmonic valve was normal in structure. Pulmonic valve regurgitation is not visualized. No evidence of pulmonic stenosis. Aorta: The aortic root is normal in size and structure. Venous: The inferior vena cava is normal in size with greater than 50% respiratory variability, suggesting right atrial pressure of 3 mmHg. IAS/Shunts: No atrial level shunt detected by color flow Doppler.  LEFT VENTRICLE PLAX 2D LVIDd:         3.48 cm  Diastology LVIDs:         2.54 cm  LV e' medial:    5.44 cm/s LV PW:  0.66 cm  LV E/e' medial:  14.7 LV IVS:        0.60 cm  LV e' lateral:   5.77 cm/s LVOT diam:     1.90 cm  LV E/e' lateral: 13.8 LV SV:         46 LV SV Index:   31 LVOT Area:     2.84 cm  RIGHT VENTRICLE            IVC RV Basal diam:  2.53 cm    IVC diam: 1.72 cm RV S prime:     9.45 cm/s TAPSE (M-mode): 1.3 cm LEFT ATRIUM             Index       RIGHT ATRIUM          Index LA diam:        2.80 cm 1.91 cm/m  RA Area:     8.43 cm LA Vol (A2C):   16.8 ml 11.47 ml/m RA Volume:   17.20 ml 11.74 ml/m LA Vol (A4C):   13.0 ml 8.87 ml/m LA Biplane Vol: 14.8 ml 10.10 ml/m  AORTIC VALVE LVOT Vmax:   74.10 cm/s LVOT Vmean:  52.450 cm/s LVOT VTI:    0.160 m  AORTA Ao Root diam: 3.10 cm MITRAL VALVE MV Area (PHT): 3.77 cm     SHUNTS MV Decel Time: 201 msec     Systemic VTI:  0.16 m MV E velocity: 79.90 cm/s   Systemic Diam: 1.90 cm MV A velocity: 108.00 cm/s MV E/A ratio:  0.74 Julien Nordmannimothy Gollan MD Electronically signed by Julien Nordmannimothy Gollan MD Signature Date/Time: 04/29/2021/2:40:16 PM    Final     Wt Readings from Last 3 Encounters:  05/11/21 47.6 kg  10/21/19 49.9 kg  04/20/18 41.2 kg    EKG: svt initially. Currently sinus tachycardia  Physical Exam:  Blood pressure 128/79, pulse (!) 101, temperature 97.9 F (36.6 C), temperature source Oral, resp. rate 15, height 5' 1.5" (1.562  m), weight 47.6 kg, SpO2 100 %. Body mass index is 19.52 kg/m. General: Well developed, well nourished, in no acute distress. Head: Normocephalic, atraumatic, sclera non-icteric, no xanthomas, nares are without discharge.  Neck: Negative for carotid bruits. JVD not elevated. Lungs: Clear bilaterally to auscultation without wheezes, rales, or rhonchi. Breathing is unlabored. Heart: RRR with S1 S2. No murmurs, rubs, or gallops appreciated. Abdomen: Soft, non-tender, non-distended with normoactive bowel sounds. No hepatomegaly. No rebound/guarding. No obvious abdominal masses. Msk:  Strength and tone appear normal for age. Extremities: No clubbing or cyanosis. No edema.  Distal pedal pulses are 2+ and equal bilaterally. Neuro: Alert and oriented X 3. No facial asymmetry. No focal deficit. Moves all extremities spontaneously. Psych:  Responds to questions appropriately with a normal affect.     Assessment and Plan  65 year old female with history of no cardiac problems who presented to emergency room with complaints of shortness of breath and fatigue.  She is had relative anorexia recently has not been compliant with p.o. intake.  On arrival to emergency room she was severely tachycardic and hypoxic.  She was given IV Cardizem with conversion back to sinus rhythm.  Her symptoms have improved.  She is currently breathing normally with a heart rate in the 90s which appears to be sinus rhythm/sinus tachycardia.  1.  SVT-etiology likely multifactorial.  Currently in sinus rhythm/sinus tachycardia.  Magnesium was somewhat low.  We will replete.  Converted to sinus rhythm with IV Cardizem.  We will  continue with  metoprolol at 24 mg succinate daily.  She had an echocardiogram less than 2 months ago showing preserved LV function.  We will continue with current regimen and follow-up.  2.  Malnutrition-continue with supplements and diet.  Signed, Dalia Heading MD 05/11/2021, 4:28 PM Pager: [phone]

## 2021-05-11 NOTE — ED Provider Notes (Signed)
[facility] Emergency Department Provider Note  ____________________________________________   Event Date/Time   First MD Initiated Contact with Patient 05/11/21 1140     (approximate)  I have reviewed the triage vital signs and the nursing notes.   HISTORY  Chief Complaint Shortness of Breath and Weakness    HPI Peggy Brown is a 65 y.o. female   here with generalized weakness.  The patient states that over the last 24 hours, she has had progressive worsening generalized weakness.  She arrives from a nursing facility, where she is due to failure to take care of herself.  She has a history of chronic alcoholism.  She arrives in obvious respiratory distress, with heart rate in the 240s.  Reports she feels like she cannot breathe.  Denies any other complaints.  She feels fatigued.  She admits to poor appetite recently.  Remainder of history limited due to mild distress on arrival.       History reviewed. No pertinent past medical history.  Patient Active Problem List   Diagnosis Date Noted   Acute diastolic CHF (congestive heart failure) (HCC) 05/11/2021   Anemia of chronic disease    Thrombocytopenia (HCC)    Acute on chronic blood loss anemia 04/26/2021   Hypotension    Alcohol abuse    Hypokalemia    Hypomagnesemia    SVT (supraventricular tachycardia) (HCC) 04/25/2021   Severe protein-calorie malnutrition (HCC) 04/21/2018   Hyponatremia 04/19/2018    Past Surgical History:  Procedure Laterality Date   CESAREAN SECTION     COLONOSCOPY N/A 04/27/2021   Procedure: COLONOSCOPY;  Surgeon: Regis Bill, MD;  Location: [facility] ENDOSCOPY;  Service: Endoscopy;  Laterality: N/A;   ESOPHAGOGASTRODUODENOSCOPY N/A 04/27/2021   Procedure: ESOPHAGOGASTRODUODENOSCOPY (EGD);  Surgeon: Regis Bill, MD;  Location: [facility] ENDOSCOPY;  Service: Endoscopy;  Laterality: N/A;    Prior to Admission medications   Medication Sig Start Date End Date  Taking? Authorizing Provider  acetaminophen (TYLENOL) 500 MG tablet Take 1,000 mg by mouth every 8 (eight) hours as needed for moderate pain or mild pain.   Yes [provider]  feeding supplement (ENSURE ENLIVE / ENSURE PLUS) LIQD Take 237 mLs by mouth 2 (two) times daily between meals. 04/30/21  Yes Marrion Coy, MD  furosemide (LASIX) 20 MG tablet Take 80 mg by mouth daily. 05/08/21 05/13/21 Yes [provider]  guaifenesin (ROBITUSSIN) 100 MG/5ML syrup Take 200 mg by mouth every 6 (six) hours as needed for cough.   Yes [provider]  nicotine (NICODERM CQ - DOSED IN MG/24 HOURS) 14 mg/24hr patch Place 1 patch (14 mg total) onto the skin daily. 05/01/21  Yes Marrion Coy, MD  pantoprazole (PROTONIX) 40 MG tablet Take 1 tablet (40 mg total) by mouth 2 (two) times daily. 04/30/21 05/30/21 Yes Marrion Coy, MD  potassium chloride (KLOR-CON) 10 MEQ tablet Take 40 mEq by mouth daily. 05/13/21 05/18/21 Yes [provider]    Allergies Patient has no known allergies.  Family History  Problem Relation Age of Onset   Hypertension Mother    Thyroid cancer Mother    COPD Father     Social History Social History   Tobacco Use   Smoking status: Every Day    Packs/day: 0.50    Years: 45.00    Pack years: 22.50    Types: Cigarettes   Smokeless tobacco: Never  Substance Use Topics   Alcohol use: Yes    Alcohol/week: 2.0 standard drinks  Types: 2 Cans of beer per week   Drug use: Never    Review of Systems  Review of Systems  Constitutional:  Positive for fatigue. Negative for fever.  HENT:  Negative for congestion and sore throat.   Eyes:  Negative for visual disturbance.  Respiratory:  Positive for chest tightness and shortness of breath. Negative for cough.   Cardiovascular:  Negative for chest pain.  Gastrointestinal:  Negative for abdominal pain, diarrhea, nausea and vomiting.  Genitourinary:  Negative for flank pain.  Musculoskeletal:  Negative for  back pain and neck pain.  Skin:  Negative for rash and wound.  Neurological:  Positive for weakness.  All other systems reviewed and are negative.   ____________________________________________  PHYSICAL EXAM:      VITAL SIGNS: ED Triage Vitals [05/11/21 1130]  Enc Vitals Group     BP 93/77     Pulse Rate (!) 112     Resp (!) 27     Temp      Temp src      SpO2 (!) 80 %     Weight      Height      Head Circumference      Peak Flow      Pain Score      Pain Loc      Pain Edu?      Excl. in GC?      Physical Exam Vitals and nursing note reviewed.  Constitutional:      General: She is in acute distress.     Appearance: She is well-developed. She is diaphoretic.  HENT:     Head: Normocephalic and atraumatic.  Eyes:     Conjunctiva/sclera: Conjunctivae normal.  Cardiovascular:     Rate and Rhythm: Regular rhythm. Tachycardia present.     Heart sounds: Normal heart sounds. No murmur heard.   No friction rub.  Pulmonary:     Effort: Pulmonary effort is normal. No respiratory distress.     Breath sounds: Examination of the right-lower field reveals rales. Examination of the left-lower field reveals rales. Rales present. No wheezing.  Abdominal:     General: There is no distension.     Palpations: Abdomen is soft.     Tenderness: There is no abdominal tenderness.  Musculoskeletal:     Cervical back: Neck supple.  Skin:    General: Skin is warm.     Capillary Refill: Capillary refill takes less than 2 seconds.  Neurological:     Mental Status: She is alert and oriented to person, place, and time.     Motor: No abnormal muscle tone.      ____________________________________________   LABS (all labs ordered are listed, but only abnormal results are displayed)  Labs Reviewed  CBC WITH DIFFERENTIAL/PLATELET - Abnormal; Notable for the following components:      Result Value   RBC 2.93 (*)    Hemoglobin 9.6 (*)    HCT 28.2 (*)    RDW 16.9 (*)    All other  components within normal limits  COMPREHENSIVE METABOLIC PANEL - Abnormal; Notable for the following components:   Glucose, Bld 132 (*)    Calcium 8.4 (*)    Total Protein 6.2 (*)    Albumin 2.2 (*)    All other components within normal limits  MAGNESIUM - Abnormal; Notable for the following components:   Magnesium 1.3 (*)    All other components within normal limits  LACTIC ACID, PLASMA - Abnormal; Notable for  the following components:   Lactic Acid, Venous 2.2 (*)    All other components within normal limits  TROPONIN I (HIGH SENSITIVITY) - Abnormal; Notable for the following components:   Troponin I (High Sensitivity) 45 (*)    All other components within normal limits  TROPONIN I (HIGH SENSITIVITY) - Abnormal; Notable for the following components:   Troponin I (High Sensitivity) 73 (*)    All other components within normal limits  SARS CORONAVIRUS 2 (TAT 6-24 HRS)  PHOSPHORUS  TSH  LACTIC ACID, PLASMA  BASIC METABOLIC PANEL    ____________________________________________  EKG: Sinus tachycardia, VR 119. PR 175, QRS 106, QTc 408. LA enlargement. RAD. Borderline repol abnormality. ________________________________________  RADIOLOGY All imaging, including plain films, CT scans, and ultrasounds, independently reviewed by me, and interpretations confirmed via formal radiology reads.  ED MD interpretation:   CXR: Mild bilateral pleural effusions with atelectasis  Official radiology report(s): DG Chest Portable 1 View  Result Date: 05/11/2021 CLINICAL DATA:  Shortness of breath. EXAM: PORTABLE CHEST 1 VIEW COMPARISON:  April 25, 2021. FINDINGS: The heart size and mediastinal contours are within normal limits. No pneumothorax is noted. Mild bilateral pleural effusions are noted with associated subsegmental atelectasis, left greater than right. The visualized skeletal structures are unremarkable. IMPRESSION: Mild bilateral pleural effusions are noted with associated subsegmental  atelectasis, left greater than right. Electronically Signed   By: Lupita Raider M.D.   On: 05/11/2021 12:41    ____________________________________________  PROCEDURES   Procedure(s) performed (including Critical Care):  .Critical Care  Date/Time: 05/11/2021 2:18 PM Performed by: Shaune Pollack, MD Authorized by: Shaune Pollack, MD   Critical care provider statement:    Critical care time (minutes):  35   Critical care time was exclusive of:  Separately billable procedures and treating other patients and teaching time   Critical care was necessary to treat or prevent imminent or life-threatening deterioration of the following conditions:  Cardiac failure, circulatory failure and respiratory failure   Critical care was time spent personally by me on the following activities:  Development of treatment plan with patient or surrogate, discussions with consultants, evaluation of patient's response to treatment, examination of patient, obtaining history from patient or surrogate, ordering and performing treatments and interventions, ordering and review of laboratory studies, ordering and review of radiographic studies, pulse oximetry, re-evaluation of patient's condition and review of old charts   I assumed direction of critical care for this patient from another provider in my specialty: no    ____________________________________________  INITIAL IMPRESSION / MDM / ASSESSMENT AND PLAN / ED COURSE  As part of my medical decision making, I reviewed the following data within the electronic MEDICAL RECORD NUMBER Nursing notes reviewed and incorporated, Old chart reviewed, Notes from prior ED visits, and [place] Controlled Substance Database       *Jrue Yambao was evaluated in Emergency Department on 05/11/2021 for the symptoms described in the history of present illness. She was evaluated in the context of the global COVID-19 pandemic, which necessitated consideration that the patient might be at  risk for infection with the SARS-CoV-2 virus that causes COVID-19. Institutional protocols and algorithms that pertain to the evaluation of patients at risk for COVID-19 are in a state of rapid change based on information released by regulatory bodies including the CDC and federal and state organizations. These policies and algorithms were followed during the patient's care in the ED.  Some ED evaluations and interventions may be delayed as a result of  limited staffing during the pandemic.*     Medical Decision Making: 65 year old female here with generalized weakness and shortness of breath.  On arrival, patient in what appears to be SVT with rate of 240.  She was given adenosine with brief return to sinus tachycardia followed by return to the 160s and 240s, and rate which appeared to be like atrial fibrillation.  She was given diltiazem with conversion to sinus tachycardia and has remained in that since then.  She was given empiric magnesium and calcium given her history of electrolyte problems.  Magnesium does return low at 1.3.  Lactic acid mildly elevated likely due to hypoperfusion from her tachycardia.  Troponin elevated, likely demand related.  She has bilateral pleural effusions which I suspect are related to her SVT and heart failure.  Will plan to admit for further monitoring.  Patient updated and in agreement.  ____________________________________________  FINAL CLINICAL IMPRESSION(S) / ED DIAGNOSES  Final diagnoses:  SVT (supraventricular tachycardia) (HCC)  Hypomagnesemia     MEDICATIONS GIVEN DURING THIS VISIT:  Medications  adenosine (ADENOCARD) 12 MG/4ML injection (  Not Given 05/11/21 1158)  acetaminophen (TYLENOL) tablet 1,000 mg (has no administration in time range)  nicotine (NICODERM CQ - dosed in mg/24 hours) patch 14 mg (has no administration in time range)  pantoprazole (PROTONIX) EC tablet 40 mg (has no administration in time range)  feeding supplement (ENSURE ENLIVE /  ENSURE PLUS) liquid 237 mL (237 mLs Oral Not Given 05/11/21 1701)  guaiFENesin (ROBITUSSIN) 100 MG/5ML solution 200 mg (has no administration in time range)  sodium chloride flush (NS) 0.9 % injection 3 mL (3 mLs Intravenous Given 05/11/21 1612)  sodium chloride flush (NS) 0.9 % injection 3 mL (has no administration in time range)  0.9 %  sodium chloride infusion (has no administration in time range)  ondansetron (ZOFRAN) injection 4 mg (has no administration in time range)  enoxaparin (LOVENOX) injection 40 mg (has no administration in time range)  furosemide (LASIX) injection 20 mg (20 mg Intravenous Given 05/11/21 1553)  metoprolol succinate (TOPROL-XL) 24 hr tablet 25 mg (25 mg Oral Given 05/11/21 1601)  adenosine (ADENOCARD) 6 MG/2ML injection (  Given 05/11/21 1133)  calcium gluconate inj 10% (1 g) URGENT USE ONLY! (1 g Intravenous Given 05/11/21 1159)  magnesium sulfate IVPB 2 g 50 mL (0 g Intravenous Stopped 05/11/21 1200)  diltiazem (CARDIZEM) injection 5 mg (5 mg Intravenous Given 05/11/21 1157)  thiamine (B-1) injection 100 mg (100 mg Intravenous Given 05/11/21 1556)  magnesium sulfate IVPB 2 g 50 mL (0 g Intravenous Stopped 05/11/21 1756)     ED Discharge Orders     None        Note:  This document was prepared using Dragon voice recognition software and may include unintentional dictation errors.   Shaune PollackIsaacs, Geneal Huebert, MD 05/11/21 [phone]

## 2021-05-11 NOTE — ED Triage Notes (Deleted)
6g Adenosine given 11:34 am Calcium gluconate given 11:34 am Magnesium sulfate given 11:35 am

## 2021-05-12 DIAGNOSIS — J9601 Acute respiratory failure with hypoxia: Secondary | ICD-10-CM | POA: Diagnosis present

## 2021-05-12 DIAGNOSIS — E876 Hypokalemia: Secondary | ICD-10-CM | POA: Diagnosis present

## 2021-05-12 DIAGNOSIS — D638 Anemia in other chronic diseases classified elsewhere: Secondary | ICD-10-CM | POA: Diagnosis present

## 2021-05-12 DIAGNOSIS — Z681 Body mass index (BMI) 19 or less, adult: Secondary | ICD-10-CM | POA: Diagnosis not present

## 2021-05-12 DIAGNOSIS — F1020 Alcohol dependence, uncomplicated: Secondary | ICD-10-CM | POA: Diagnosis present

## 2021-05-12 DIAGNOSIS — I4891 Unspecified atrial fibrillation: Secondary | ICD-10-CM | POA: Diagnosis present

## 2021-05-12 DIAGNOSIS — F17210 Nicotine dependence, cigarettes, uncomplicated: Secondary | ICD-10-CM | POA: Diagnosis present

## 2021-05-12 DIAGNOSIS — I5031 Acute diastolic (congestive) heart failure: Secondary | ICD-10-CM | POA: Diagnosis present

## 2021-05-12 DIAGNOSIS — Z79899 Other long term (current) drug therapy: Secondary | ICD-10-CM | POA: Diagnosis not present

## 2021-05-12 DIAGNOSIS — E43 Unspecified severe protein-calorie malnutrition: Secondary | ICD-10-CM | POA: Diagnosis present

## 2021-05-12 DIAGNOSIS — I471 Supraventricular tachycardia: Secondary | ICD-10-CM | POA: Diagnosis present

## 2021-05-12 DIAGNOSIS — Z20822 Contact with and (suspected) exposure to covid-19: Secondary | ICD-10-CM | POA: Diagnosis present

## 2021-05-12 LAB — SARS CORONAVIRUS 2 (TAT 6-24 HRS): SARS Coronavirus 2: NEGATIVE

## 2021-05-12 LAB — BASIC METABOLIC PANEL
Anion gap: 5 (ref 5–15)
BUN: 13 mg/dL (ref 8–23)
CO2: 33 mmol/L — ABNORMAL HIGH (ref 22–32)
Calcium: 8.3 mg/dL — ABNORMAL LOW (ref 8.9–10.3)
Chloride: 103 mmol/L (ref 98–111)
Creatinine, Ser: 0.62 mg/dL (ref 0.44–1.00)
GFR, Estimated: >60 mL/min (ref >60)
Glucose, Bld: 85 mg/dL (ref 70–99)
Potassium: 3.5 mmol/L (ref 3.5–5.1)
Sodium: 141 mmol/L (ref 135–145)

## 2021-05-12 MED ORDER — FUROSEMIDE 20 MG PO TABS
20.0000 mg | ORAL_TABLET | Freq: Every day | ORAL | Status: DC
  Administered 2021-05-12 – 2021-05-13 (×2): 20 mg via ORAL
  Filled 2021-05-12 (×2): qty 1

## 2021-05-12 NOTE — Progress Notes (Signed)
Patient Name: Jonalyn Sedlak Date of Encounter: 05/12/2021  Hospital Problem List     Principal Problem:   SVT (supraventricular tachycardia) (HCC) Active Problems:   Severe protein-calorie malnutrition (HCC)   Hypomagnesemia   Anemia of chronic disease   Acute diastolic CHF (congestive heart failure) [facility])    Patient Profile     65 y.o. female with history of SVT, anemia, thrombocytopenia, malnutrition who was brought to the emergency room with apparent complaints of shortness of breath and possible seizure.  Patient is not able to give history.  She apparently also had about 24-hour history of generalized weakness and fatigue.  She has a history of chronic alcoholism and on arrival was in respiratory distress with extremely rapid narrow complex tachycardia in the 200+ range.  At the time of my exam she was resting comfortably.  Heart rate was in the 90s.  She was not able to give any significant history.  She apparently is on furosemide 80 mg daily, potassium, pantoprazole and Ensure supplements.  In the emergency room she was given Cardizem IV with resultant conversion to sinus tachycardia.  Her magnesium was 1.3.  Chest x-ray revealed small bilateral pleural effusions.  High-sensitivity troponin was 45.  Creatinine was 0.85.  Potassium was 3.9.  TSH is 1.628.  Magnesium is 1.3.  Echocardiogram done at previous visit last month was read as showing an EF of 55% with no significant valvular abnormalities.  Subjective   Rate well controlled this morning.   Inpatient Medications     enoxaparin (LOVENOX) injection  40 mg Subcutaneous Q24H   feeding supplement  237 mL Oral BID BM   furosemide  20 mg Intravenous Daily   metoprolol succinate  25 mg Oral Daily   nicotine  14 mg Transdermal Daily   pantoprazole  40 mg Oral BID   sodium chloride flush  3 mL Intravenous Q12H    Vital Signs    Vitals:   05/12/21 0900 05/12/21 0915 05/12/21 0931 05/12/21 1000  BP: 139/75 (!) 134/91   (!) 128/99  Pulse: 83 78  79  Resp: Temp:      TempSrc:      SpO2: (!) 84% 100% 98% 98%  Weight:      Height:        Intake/Output Summary (Last 24 hours) at 05/12/2021 1617 Last data filed at 05/11/2021 1756 Gross per 24 hour  Intake 50 ml  Output --  Net 50 ml   Filed Weights   05/11/21 1148  Weight: 47.6 kg    Physical Exam    GEN: Well nourished, well developed, in no acute distress.  HEENT: normal.  Neck: Supple, no JVD, carotid bruits, or masses. Cardiac: RRR, no murmurs, rubs, or gallops. No clubbing, cyanosis, edema.  Radials/DP/PT 2+ and equal bilaterally.  Respiratory:  Respirations regular and unlabored, clear to auscultation bilaterally. GI: Soft, nontender, nondistended, BS + x 4. MS: no deformity or atrophy. Skin: warm and dry, no rash. Neuro:  Strength and sensation are intact. Psych: Normal affect.  Labs    CBC Recent Labs    05/11/21 1131  WBC 8.8  NEUTROABS 6.3  HGB 9.6*  HCT 28.2*  MCV 96.2  PLT 289   Basic Metabolic Panel Recent Labs    16/10/96 1131 05/12/21 0458  NA 140 141  K 3.9 3.5  CL 103 103  CO2 28 33*  GLUCOSE 132* 85  BUN 13 13  CREATININE 0.85 0.62  CALCIUM  8.4* 8.3*  MG 1.3*  --   PHOS 3.8  --    Liver Function Tests Recent Labs    05/11/21 1131  AST 30  ALT 16  ALKPHOS 76  BILITOT 0.6  PROT 6.2*  ALBUMIN 2.2*   No results for input(s): LIPASE, AMYLASE in the last 72 hours. Cardiac Enzymes No results for input(s): CKTOTAL, CKMB, CKMBINDEX, TROPONINI in the last 72 hours. BNP No results for input(s): BNP in the last 72 hours. D-Dimer No results for input(s): DDIMER in the last 72 hours. Hemoglobin A1C No results for input(s): HGBA1C in the last 72 hours. Fasting Lipid Panel No results for input(s): CHOL, HDL, LDLCALC, TRIG, CHOLHDL, LDLDIRECT in the last 72 hours. Thyroid Function Tests Recent Labs    05/11/21 1131  TSH 1.628    Telemetry    nsr  ECG    nsr  Radiology    US  Abdomen Complete  Result Date: 04/26/2021 CLINICAL DATA:  Elevated liver enzymes, thrombocytopenia EXAM: ABDOMEN ULTRASOUND COMPLETE COMPARISON:  None. FINDINGS: Gallbladder: There is sludge and small stones within a nondilated gallbladder. There is no wall thickening or pericholecystic fluid. Common bile duct: Diameter: 2.0 mm Liver: No focal lesion identified. Within normal limits in parenchymal echogenicity. Portal vein is patent on color Doppler imaging with normal direction of blood flow towards the liver. IVC: No abnormality visualized. Pancreas: Visualized portion unremarkable. Spleen: Size and appearance within normal limits. Right Kidney: Length: 10.8 cm.  No hydronephrosis or visible mass. Left Kidney: Length: 8.9 cm.  No hydronephrosis or visible mass. Abdominal aorta: Portions obscured by bowel gas. Some atherosclerotic disease noted. No aneurysm. Other findings: Right pleural effusion. IMPRESSION: Sludge and small stones within the gallbladder. No evidence of acute cholecystitis or biliary obstruction. Right pleural effusion. Electronically Signed   By: Caprice Renshaw   On: 04/26/2021 18:55   CT CHEST ABDOMEN PELVIS W CONTRAST  Result Date: 04/28/2021 CLINICAL DATA:  Unintended weight loss EXAM: CT CHEST, ABDOMEN, AND PELVIS WITH CONTRAST TECHNIQUE: Multidetector CT imaging of the chest, abdomen and pelvis was performed following the standard protocol during bolus administration of intravenous contrast. CONTRAST:  44mL OMNIPAQUE IOHEXOL 300 MG/ML  SOLN COMPARISON:  Abdominal ultrasound 04/26/2021 FINDINGS: CT CHEST FINDINGS Cardiovascular: Conventional 3 vessel arch anatomy. Scattered atherosclerotic calcifications throughout the thoracic aorta. No evidence of aneurysm. The heart is normal in size. No pericardial effusion. Unremarkable size of the main and central pulmonary arteries. Mediastinum/Nodes: Unremarkable CT appearance of the thyroid gland. No suspicious mediastinal or hilar adenopathy. No  soft tissue mediastinal mass. The thoracic esophagus is unremarkable. Lungs/Pleura: Small to moderate bilateral layering pleural effusions. Mild interlobular septal thickening. Centrilobular pulmonary emphysema and diffuse mild bronchial wall thickening. Compressive atelectasis in both lower lobes secondary to the presence of the pleural effusions. No focal airspace infiltrate, pulmonary mass or pulmonary nodule. Musculoskeletal: No acute fracture or aggressive appearing lytic or blastic osseous lesion. CT ABDOMEN PELVIS FINDINGS Hepatobiliary: Normal hepatic contour and morphology. No discrete hepatic lesion. No intra or extrahepatic biliary ductal dilatation. Small stones layer within the neck of the gallbladder. No gallbladder distension or wall thickening. Pancreas: Unremarkable. No pancreatic ductal dilatation or surrounding inflammatory changes. Spleen: Normal in size without focal abnormality. Adrenals/Urinary Tract: Normal adrenal glands. No evidence of nephrolithiasis, hydronephrosis or enhancing renal mass. The ureters and bladder are unremarkable. Stomach/Bowel: Abnormal appearance of the mucosa of the stomach, duodenum and proximal jejunum. There is significant edematous submucosal thickening throughout the segments of bowel. The ileum  and colon are relatively spared. No evidence of obstruction. Vascular/Lymphatic: Atherosclerotic calcifications throughout the abdominal aorta. Patent celiac, SMA and IMA without significant stenosis. There are significant stenoses in the right iliac tree. Reproductive: Uterus and bilateral adnexa are unremarkable. Other: Diffuse dependent anasarca. Small amount of fluid present within the abdomen along the pericolic gutters and in the pelvic recesses consistent with ascites. Musculoskeletal: No acute or significant osseous findings. IMPRESSION: 1. Moderate bilateral pleural effusions, trace interstitial pulmonary edema, anasarca, small volume ascites, and diffuse  submucosal thickening within the stomach, duodenum and jejunum. In the setting of normal cardiac size, overall findings are consistent with third spacing or other noncardiogenic edema. Given the marked submucosal edema in the stomach and proximal small bowel, a protein losing enteropathy is a consideration, however hypoalbuminemia in and of itself can lead to such an edematous state. Additional infectious and inflammatory processes of the small bowel can also yield a similar appearance for that portion of the findings. 2. Mild centrilobular pulmonary emphysema. 3. Aortic atherosclerosis. 4. Cholelithiasis without evidence of cholecystitis. 5. 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 Does the patient have a clinical history of right lower extremity claudication? Aortic Atherosclerosis (ICD10-I70.0) and Emphysema (ICD10-J43.9). Electronically Signed   By: Malachy Moan M.D.   On: 04/28/2021 07:39   DG Chest Portable 1 View  Result Date: 05/11/2021 CLINICAL DATA:  Shortness of breath. EXAM: PORTABLE CHEST 1 VIEW COMPARISON:  April 25, 2021. FINDINGS: The heart size and mediastinal contours are within normal limits. No pneumothorax is noted. Mild bilateral pleural effusions are noted with associated subsegmental atelectasis, left greater than right. The visualized skeletal structures are unremarkable. IMPRESSION: Mild bilateral pleural effusions are noted with associated subsegmental atelectasis, left greater than right. Electronically Signed   By: Lupita Raider M.D.   On: 05/11/2021 12:41   DG Chest Portable 1 View  Result Date: 04/25/2021 CLINICAL DATA:  Pt states weakness x few days. Denies vomiting, diarrhea, fever. Pt denies pain. Speech clear. Moving all extremities. Pt states eyes blurry bilaterally. HR 200 in triage. EXAM: PORTABLE CHEST 1 VIEW COMPARISON:  04/19/2018 FINDINGS: Cardiac silhouette is normal in size. No mediastinal or hilar masses.  Clear lungs.  No pleural effusion or pneumothorax. Skeletal structures are grossly intact. IMPRESSION: No active disease. Electronically Signed   By: Amie Portland M.D.   On: 04/25/2021 19:30   ECHOCARDIOGRAM COMPLETE  Result Date: 04/29/2021    ECHOCARDIOGRAM REPORT   Patient Name:   ETHELEEN VALTIERRA Date of Exam: 04/28/2021 Medical Rec #:  [id number]        Height:       61.0 in Accession #:    [id number]       Weight:       110.0 lb Date of Birth:  22-Jan-1956        BSA:          1.465 m Patient Age:    64 years         BP:           117/76 mmHg Patient Gender: F                HR:           84 bpm. Exam Location:  [facility] Procedure: 2D Echo, Cardiac Doppler and Color Doppler Indications:     I50.31 Acute diastolic congestive heart failure  History:         Patient has no prior history of  Echocardiogram examinations.  Sonographer:     Sedonia SmallNaTashia Rodgers-Jones Referring Phys:  [id number] Marrion CoyEKUI ZHANG Diagnosing Phys: Julien Nordmannimothy Gollan MD IMPRESSIONS  1. Left ventricular ejection fraction, by estimation, is 55 %. The left ventricle has normal function. The left ventricle has no regional wall motion abnormalities. Left ventricular diastolic parameters are consistent with Grade I diastolic dysfunction (impaired relaxation).  2. Right ventricular systolic function is normal. The right ventricular size is normal. Tricuspid regurgitation signal is inadequate for assessing PA pressure.  3. The mitral valve is normal in structure. Mild mitral valve regurgitation. FINDINGS  Left Ventricle: Left ventricular ejection fraction, by estimation, is 55 %. The left ventricle has normal function. The left ventricle has no regional wall motion abnormalities. The left ventricular internal cavity size was normal in size. There is no left ventricular hypertrophy. Left ventricular diastolic parameters are consistent with Grade I diastolic dysfunction (impaired relaxation). Right Ventricle: The right ventricular size is normal. No increase in  right ventricular wall thickness. Right ventricular systolic function is normal. Tricuspid regurgitation signal is inadequate for assessing PA pressure. Left Atrium: Left atrial size was normal in size. Right Atrium: Right atrial size was normal in size. Pericardium: There is no evidence of pericardial effusion. Mitral Valve: The mitral valve is normal in structure. Mild mitral valve regurgitation. No evidence of mitral valve stenosis. Tricuspid Valve: The tricuspid valve is normal in structure. Tricuspid valve regurgitation is not demonstrated. No evidence of tricuspid stenosis. Aortic Valve: The aortic valve is normal in structure. Aortic valve regurgitation is not visualized. Mild to moderate aortic valve sclerosis/calcification is present, without any evidence of aortic stenosis. Pulmonic Valve: The pulmonic valve was normal in structure. Pulmonic valve regurgitation is not visualized. No evidence of pulmonic stenosis. Aorta: The aortic root is normal in size and structure. Venous: The inferior vena cava is normal in size with greater than 50% respiratory variability, suggesting right atrial pressure of 3 mmHg. IAS/Shunts: No atrial level shunt detected by color flow Doppler.  LEFT VENTRICLE PLAX 2D LVIDd:         3.48 cm  Diastology LVIDs:         2.54 cm  LV e' medial:    5.44 cm/s LV PW:         0.66 cm  LV E/e' medial:  14.7 LV IVS:        0.60 cm  LV e' lateral:   5.77 cm/s LVOT diam:     1.90 cm  LV E/e' lateral: 13.8 LV SV:         46 LV SV Index:   31 LVOT Area:     2.84 cm  RIGHT VENTRICLE            IVC RV Basal diam:  2.53 cm    IVC diam: 1.72 cm RV S prime:     9.45 cm/s TAPSE (M-mode): 1.3 cm LEFT ATRIUM             Index       RIGHT ATRIUM          Index LA diam:        2.80 cm 1.91 cm/m  RA Area:     8.43 cm LA Vol (A2C):   16.8 ml 11.47 ml/m RA Volume:   17.20 ml 11.74 ml/m LA Vol (A4C):   13.0 ml 8.87 ml/m LA Biplane Vol: 14.8 ml 10.10 ml/m  AORTIC VALVE LVOT Vmax:   74.10 cm/s LVOT  Vmean:  52.450 cm/s LVOT VTI:  0.160 m  AORTA Ao Root diam: 3.10 cm MITRAL VALVE MV Area (PHT): 3.77 cm     SHUNTS MV Decel Time: 201 msec     Systemic VTI:  0.16 m MV E velocity: 79.90 cm/s   Systemic Diam: 1.90 cm MV A velocity: 108.00 cm/s MV E/A ratio:  0.74 Julien Nordmann MD Electronically signed by Julien Nordmann MD Signature Date/Time: 04/29/2021/2:40:16 PM    Final     Assessment & Plan    65 year old female with history of no cardiac problems who presented to emergency room with complaints of shortness of breath and fatigue.  She is had relative anorexia recently has not been compliant with p.o. intake.  On arrival to emergency room she was severely tachycardic and hypoxic.  She was given IV Cardizem with conversion back to sinus rhythm.  Her symptoms have improved.  She is currently breathing normally with a heart rate in the 90s which appears to be sinus rhythm/sinus tachycardia.  1.  SVT-etiology likely multifactorial.  Currently in sinus rhythm/sinus tachycardia.  Magnesium was somewhat low.  We will replete.  Converted to sinus rhythm with IV Cardizem.  We will continue with  metoprolol at 24 mg succinate daily.  She had an echocardiogram less than 2 months ago showing preserved LV function.  We will continue with current regimen and follow-up. Troponin elevation secondary to demand. No evidence of ischemia.    OK for discharge from cardiac standpoint. Follow up as outpatient.  2.  Malnutrition-continue with supplements and diet.  Signed, Darlin Priestly Kimyata Milich MD 05/12/2021, 4:17 PM  Pager: [phone]

## 2021-05-12 NOTE — ED Notes (Signed)
Cardiologist at bedside. Zoll pads removed from pt. Oxygen turned off. SPO2 93% on RA.

## 2021-05-12 NOTE — Plan of Care (Signed)

## 2021-05-12 NOTE — ED Notes (Signed)
Dr Pola Corn was at bedside. Cardiologist this morning had said to hold lasix. Dr Pola Corn will consult with cardiologist and let this nurse know whether to hold or give ordered lasix.

## 2021-05-12 NOTE — Progress Notes (Signed)
  CSW contacted Heart Failure RN, w/ heart failure consult request. 

## 2021-05-12 NOTE — ED Notes (Addendum)
Pt calling out using call light. This tech answered and noticed that pt O2 level was 78%-80% on RA. Barbee Cough, RN notified. Pt placed back on 4L O2 via [place]. Pt O2 saturation now at 99% 4L O2. Call light within reach. Pt has no needs at this time. Will continue to monitor.

## 2021-05-12 NOTE — ED Notes (Signed)
Pt currently on 4L [place]. 98%% SPO2. Will attempt to titrate to 2L [place]. Pt denies SOB, CP at this time. NSR on monitor.

## 2021-05-12 NOTE — Progress Notes (Signed)
PROGRESS NOTE  Catrena Vari  DOB: [birth year]  PCP: Patient, No Pcp Per (Inactive) SEG:[id number]  DOA: 05/11/2021  LOS: 0 days  Hospital Day: 2   Chief Complaint  Patient presents with   Shortness of Breath   Weakness    Brief narrative: Peggy Brown is a 65 y.o. female with PMH significant for SVT, anemia, thrombocytopenia, malnutrition, history of chronic alcoholism. Patient was brought to the ED on 8/9 with complaint of shortness of breath, generalized weakness and fatigue  On arrival, patient was in respiratory distress. EKG showed SVT with rapid narrow complex in the 200+ range. She was given IV adenosine 6 mg as well as Cardizem 5 mg IV after which heart rate slowed down Admitted to hospital service.   Cardiology consultation was obtained.  Subjective: Patient was seen and examined this morning.  Thin built elderly African-American female.  Propped up in bed. Not in distress.  Assessment/Plan: SVT -Multifactorial etiology -Converted to sinus rhythm with IV adenosine and IV Cardizem. -Recent echocardiogram 2 months ago with preserved LV function. -Continue to monitor in telemetry -TSH normal. Recent Labs    04/25/21 1844 04/26/21 0812 05/11/21 1131  TSH 2.918 2.388 1.628   Acute diastolic dysfunction CHF -Patient's last known LVEF was 55% from July 2020 -She presented for evaluation of shortness of breath and has bilateral lower extremity swelling -Chest x-ray shows bilateral pleural effusions -Home meds include Lasix 80 mg daily -Given IV Lasix in the ED yesterday.  Given Lasix 20 mg oral this morning.   Hypomagnesemia -Replacement given. Recent Labs  Lab 05/11/21 1131 05/12/21 0458  K 3.9 3.5  MG 1.3*  --   PHOS 3.8  --     Severe protein calorie malnutrition Continue low-sodium diet Add nutritional supplements   Anemia of chronic disease H&H is stable Recent Labs    04/25/21 1844 04/25/21 2317 04/26/21 0812 04/26/21 1020  04/26/21 2132 04/27/21 0442 04/28/21 0443 04/29/21 0509 05/11/21 1131  HGB 6.8* 5.4* 11.7* 11.7* 11.5* 10.9* 10.6* 10.7* 9.6*   Mobility: Encourage ambulation Code Status:   Code Status: Full Code  Nutritional status: Body mass index is 19.52 kg/m.     Diet:  Diet Order             Diet 2 gram sodium Room service appropriate? Yes; Fluid consistency: Thin  Diet effective now                  DVT prophylaxis:  enoxaparin (LOVENOX) injection 40 mg Start: 05/11/21 2200   Antimicrobials: None Fluid: None Consultants: Cardiology Family Communication: None at bedside  Status is: Inpatient  Remains inpatient appropriate because: Cardiac monitoring, diuresis  Dispo: The patient is from: Home              Anticipated d/c is to: Home in 1 to 2 days              Patient currently is not medically stable to d/c.   Difficult to place patient No     Infusions:   sodium chloride      Scheduled Meds:  enoxaparin (LOVENOX) injection  40 mg Subcutaneous Q24H   feeding supplement  237 mL Oral BID BM   furosemide  20 mg Oral Daily   metoprolol succinate  25 mg Oral Daily   nicotine  14 mg Transdermal Daily   pantoprazole  40 mg Oral BID   sodium chloride flush  3 mL Intravenous Q12H    Antimicrobials: Anti-infectives (From admission,  onward)    None       PRN meds: sodium chloride, acetaminophen, guaiFENesin, ondansetron (ZOFRAN) IV, sodium chloride flush   Objective: Vitals:   05/12/21 1600 05/12/21 1657  BP:  130/75  Pulse: 84 84  Resp: 18 19  Temp:  98.5 F (36.9 C)  SpO2: 98% 97%   No intake or output data in the 24 hours ending 05/12/21 1845 Filed Weights   05/11/21 1148  Weight: 47.6 kg   Weight change:  Body mass index is 19.52 kg/m.   Physical Exam: General exam: Pleasant, elderly thin built African-American female Skin: No rashes, lesions or ulcers. HEENT: Atraumatic, normocephalic, no obvious bleeding Lungs: Clear to auscultation  bilaterally CVS: Regular rate and rhythm, no murmur GI/Abd soft, nontender, nondistended, bowel sound present CNS: Alert, awake monitor x3 Psychiatry: Mood appropriate Extremities: thin legs but has pedal edema 1+ bilaterally  Data Review: I have personally reviewed the laboratory data and studies available.  Recent Labs  Lab 05/11/21 1131  WBC 8.8  NEUTROABS 6.3  HGB 9.6*  HCT 28.2*  MCV 96.2  PLT 289   Recent Labs  Lab 05/11/21 1131 05/12/21 0458  NA 140 141  K 3.9 3.5  CL 103 103  CO2 28 33*  GLUCOSE 132* 85  BUN 13 13  CREATININE 0.85 0.62  CALCIUM 8.4* 8.3*  MG 1.3*  --   PHOS 3.8  --     F/u labs  Unresulted Labs (From admission, onward)     Start     Ordered   05/18/21 0500  Creatinine, serum  (enoxaparin (LOVENOX)    CrCl >/= 30 ml/min)  Weekly,   STAT     Comments: while on enoxaparin therapy    05/11/21 1514   05/12/21 0500  Basic metabolic panel  Daily,   STAT      05/11/21 1514            Signed, Lorin Glass, MD [facility] 05/12/2021

## 2021-05-12 NOTE — ED Notes (Signed)
Pt awake and alert now. Pt denies CP and SOB. Oxygen turned from 4L to 3L [place], then to 2L [place].

## 2021-05-13 LAB — BASIC METABOLIC PANEL
Anion gap: 8 (ref 5–15)
BUN: 13 mg/dL (ref 8–23)
CO2: 31 mmol/L (ref 22–32)
Calcium: 8.2 mg/dL — ABNORMAL LOW (ref 8.9–10.3)
Chloride: 100 mmol/L (ref 98–111)
Creatinine, Ser: 0.49 mg/dL (ref 0.44–1.00)
GFR, Estimated: >60 mL/min (ref >60)
Glucose, Bld: 87 mg/dL (ref 70–99)
Potassium: 2.6 mmol/L — CL (ref 3.5–5.1)
Sodium: 139 mmol/L (ref 135–145)

## 2021-05-13 MED ORDER — METOPROLOL SUCCINATE ER 25 MG PO TB24: 25.0000 mg | ORAL_TABLET | Freq: Every day | ORAL | Status: AC

## 2021-05-13 MED ORDER — FUROSEMIDE 20 MG PO TABS: 20.0000 mg | ORAL_TABLET | Freq: Every day | ORAL | Status: DC

## 2021-05-13 MED ORDER — POTASSIUM CHLORIDE CRYS ER 20 MEQ PO TBCR
40.0000 meq | EXTENDED_RELEASE_TABLET | ORAL | Status: AC
Start: 2021-05-13 — End: 2021-05-13
  Administered 2021-05-13 (×2): 40 meq via ORAL
  Filled 2021-05-13 (×2): qty 2

## 2021-05-13 MED ORDER — ADULT MULTIVITAMIN W/MINERALS CH: 1.0000 | ORAL_TABLET | Freq: Every day | ORAL | Status: AC

## 2021-05-13 MED ORDER — POTASSIUM CHLORIDE ER 10 MEQ PO TBCR: 40.0000 meq | EXTENDED_RELEASE_TABLET | Freq: Every day | ORAL | Status: DC

## 2021-05-13 MED ORDER — ENSURE ENLIVE PO LIQD: 237.0000 mL | Freq: Three times a day (TID) | ORAL | Status: DC

## 2021-05-13 MED ORDER — DIPHENHYDRAMINE HCL 25 MG PO CAPS
25.0000 mg | ORAL_CAPSULE | Freq: Four times a day (QID) | ORAL | Status: DC | PRN
  Administered 2021-05-13: 25 mg via ORAL
  Filled 2021-05-13: qty 1

## 2021-05-13 MED ORDER — ADULT MULTIVITAMIN W/MINERALS CH: 1.0000 | ORAL_TABLET | Freq: Every day | ORAL | Status: DC

## 2021-05-13 NOTE — TOC Transition Note (Signed)
Transition of Care [facility]) - CM/SW Discharge Note   Patient Details  Name: Jayliani Wanner MRN: [id number] Date of Birth: 05/21/1956  Transition of [facility]) CM/SW Contact:  Gildardo Griffes, LCSW Phone Number: 05/13/2021, 3:22 PM   Clinical Narrative:     Patient will DC [facility] Anticipated DC date: 05/13/21 Family notified:Jessica Transport KZ:[id number]  Per MD patient ready for DC to [facility]. RN, patient, patient's family, and facility notified of DC. Discharge Summary sent to facility. RN given number for report   [phone] Room 70B. DC packet on chart. Ambulance transport requested for patient.  CSW signing off.  Angeline Slim, LCSW    Final next level of care: Skilled Nursing Facility Barriers to Discharge: No Barriers Identified   Patient Goals and CMS Choice Patient states their goals for this hospitalization and ongoing recovery are:: to SNF CMS Medicare.gov Compare Post Acute Care list provided to:: Patient Choice offered to / list presented to : Patient  Discharge Placement              Patient chooses bed at: [facility] Patient to be transferred to facility by: [facility] Name of family member notified: [name] Patient and family notified of of transfer: 05/13/21  Discharge Plan and Services     Post Acute Care Choice: Skilled Nursing Facility                               Social Determinants of Health (SDOH) Interventions     Readmission Risk Interventions No flowsheet data found.

## 2021-05-13 NOTE — Progress Notes (Addendum)
PROGRESS NOTE  Peggy Brown  DOB: Dec 08, 1955  PCP: Patient, No Pcp Per (Inactive) VQQ:[id number]  DOA: 05/11/2021  LOS: 1 day  Hospital Day: 3   Chief Complaint  Patient presents with   Shortness of Breath   Weakness    Brief narrative: Peggy Brown is a 65 y.o. female with PMH significant for SVT, anemia, thrombocytopenia, malnutrition, history of chronic alcoholism. Patient was brought to the ED on 8/9 with complaint of shortness of breath, generalized weakness and fatigue  On arrival, patient was in respiratory distress. EKG showed SVT with rapid narrow complex in the 200+ range. She was given IV adenosine 6 mg as well as Cardizem 5 mg IV after which heart rate slowed down Admitted to hospital service.   Cardiology consultation was obtained.  Subjective: Patient was seen and examined this morning.  Elderly African-American female.  Sitting up in chair.  Not in distress.  Was on low-flow oxygen at 1 L/min.  I weaned her off the oxygen and she did fine with that. Not in physical distress.   Assessment/Plan: SVT -Multifactorial etiology -Converted to sinus rhythm with IV adenosine and IV Cardizem. -Recent echocardiogram 2 months ago with preserved LV function. -Continue to monitor in telemetry -TSH normal. -Currently on metoprolol succinate 25 mg daily. Recent Labs    04/25/21 1844 04/26/21 0812 05/11/21 1131  TSH 2.918 2.388 1.628    Acute diastolic dysfunction CHF Acute respiratory failure with hypoxia -Patient's last known LVEF was 55% from July 2020 -She presented for evaluation of shortness of breath and has bilateral lower extremity swelling -Chest x-ray shows bilateral pleural effusions -Home meds include Lasix 80 mg daily -Currently resumed on a lower dose of Lasix at 20 mg daily. -Does not use supplemental oxygen at home.  O2 sat was low at 84% on room air in the ED.  She was started on oxygen by nasal cannula.  She has been weaned off back to room  air this morning.  Hypokalemia/hypomagnesemia -Potassium low at 2.6 today.  Oral replacement given.  Magnesium level was low at 1.3 yesterday.  Replacement was given. Recent Labs  Lab 05/11/21 1131 05/12/21 0458 05/13/21 0640  K 3.9 3.5 2.6*  MG 1.3*  --   --   PHOS 3.8  --   --      Severe protein calorie malnutrition -Continue low-sodium diet -Add nutritional supplements   Anemia of chronic disease -H&H is stable Recent Labs    04/25/21 1844 04/25/21 2317 04/26/21 0812 04/26/21 1020 04/26/21 2132 04/27/21 0442 04/28/21 0443 04/29/21 0509 05/11/21 1131  HGB 6.8* 5.4* 11.7* 11.7* 11.5* 10.9* 10.6* 10.7* 9.6*    Mobility: Encourage ambulation Code Status:   Code Status: Full Code  Nutritional status: Body mass index is 17.91 kg/m.     Diet:  Diet Order             Diet 2 gram sodium Room service appropriate? Yes; Fluid consistency: Thin  Diet effective now                  DVT prophylaxis:  enoxaparin (LOVENOX) injection 40 mg Start: 05/11/21 2200   Antimicrobials: None Fluid: None Consultants: Cardiology Family Communication: None at bedside  Status is: Inpatient  Remains inpatient appropriate because: Cardiac monitoring, diuresis  Dispo: The patient is from: Home              Anticipated d/c is to: Home in 1 to 2 days  Patient currently is not medically stable to d/c.   Difficult to place patient No     Infusions:   sodium chloride      Scheduled Meds:  enoxaparin (LOVENOX) injection  40 mg Subcutaneous Q24H   feeding supplement  237 mL Oral BID BM   furosemide  20 mg Oral Daily   metoprolol succinate  25 mg Oral Daily   nicotine  14 mg Transdermal Daily   pantoprazole  40 mg Oral BID   [START ON 05/14/2021] potassium chloride  40 mEq Oral Daily   sodium chloride flush  3 mL Intravenous Q12H    Antimicrobials: Anti-infectives (From admission, onward)    None       PRN meds: sodium chloride, acetaminophen,  diphenhydrAMINE, guaiFENesin, ondansetron (ZOFRAN) IV, sodium chloride flush   Objective: Vitals:   05/13/21 0747 05/13/21 1203  BP: (!) 141/82 118/68  Pulse: 78 88  Resp: 18 16  Temp: (!) 97.4 F (36.3 C) 98.3 F (36.8 C)  SpO2: 91% 100%    Intake/Output Summary (Last 24 hours) at 05/13/2021 1311 Last data filed at 05/13/2021 0950 Gross per 24 hour  Intake 123 ml  Output 150 ml  Net -27 ml   Filed Weights   05/11/21 1148 05/12/21 1852  Weight: 47.6 kg 43 kg   Weight change: -4.627 kg Body mass index is 17.91 kg/m.   Physical Exam: General exam: Pleasant, elderly thin built African-American female Skin: No rashes, lesions or ulcers. HEENT: Atraumatic, normocephalic, no obvious bleeding Lungs: Clear to auscultate bilaterally CVS: Regular rate and rhythm, no murmur GI/Abd soft, nontender, nondistended, bowel sound present CNS: Alert, awake monitor x3 Psychiatry: Mood appropriate Extremities: Trace bilateral pedal edema.  Data Review: I have personally reviewed the laboratory data and studies available.  Recent Labs  Lab 05/11/21 1131  WBC 8.8  NEUTROABS 6.3  HGB 9.6*  HCT 28.2*  MCV 96.2  PLT 289    Recent Labs  Lab 05/11/21 1131 05/12/21 0458 05/13/21 0640  NA 140 141 139  K 3.9 3.5 2.6*  CL 103 103 100  CO2 28 33* 31  GLUCOSE 132* 85 87  BUN 13 13 13   CREATININE 0.85 0.62 0.49  CALCIUM 8.4* 8.3* 8.2*  MG 1.3*  --   --   PHOS 3.8  --   --      F/u labs  Unresulted Labs (From admission, onward)     Start     Ordered   05/18/21 0500  Creatinine, serum  (enoxaparin (LOVENOX)    CrCl >/= 30 ml/min)  Weekly,   STAT     Comments: while on enoxaparin therapy    05/11/21 1514   05/12/21 0500  Basic metabolic panel  Daily,   STAT      05/11/21 1514            Signed, 07/11/21, MD [facility] 05/13/2021

## 2021-05-13 NOTE — Consult Note (Signed)
   Heart Failure Nurse Navigator Note  Heart failure with preserved ejection fraction of 55%.  Grade 1 diastolic dysfunction.  Mild mitral regurgitation.   She presented to the emergency room with complaints of shortness of breath and palpitations.  She was noted to be tachycardic with heart rates in the 240s.  Also noted dizziness lightheadedness.  Comorbidities:  Alcohol abuse Anemia  Medications:  Furosemide 20 mg daily Metoprolol succinate 25 mg daily She received 40 mEq of potassium x2  Labs:  Sodium 139, potassium 2.6, chloride 90, CO2 31, BUN 13, creatinine 0.  4 9 Intake 3 mL Output 150 mL Weight documented on admission at 1900 was 43 kg    Assessment:  General-she is awake and alert sitting up in the chair currently on O2 per nasal cannula.  HEENT-pupils are equal, no JVD  Cardiac-heart tones are regular rate and rhythm.  Chest-breath sounds are clear to posterior auscultation.  Lower extremities-there is no edema noted  Psych-she is pleasant and appropriate  Neurologic-speech is clear, moves all extremities without difficulty.   Met with patient today this is the initial meeting.  She states prior to being in the care facility she had lived at home by herself.  She states that she was report eater, lots of times would not eat breakfast but may have a cup of coffee.  Discussed making healthy choices, low sodium and not using salt at the table.  Also discussed the type of heart failure that she has.  She would sometimes fix herself a hamburger.  She states that she used to drink 40 ounces beer daily.  She was given the living with heart failure teaching booklet along with low-sodium handout and zone magnet.  Neither of her daughters were at the bedside.  Pricilla Riffle RN [facility]

## 2021-05-13 NOTE — Progress Notes (Signed)
Report given to [facility]. Patient updated on plan of care. No acute distress at this time. Unable to wean patient from O2. Sats 88% on room air, placed back on 2L. IV removed from patient. CSW called EMS for transportation.

## 2021-05-13 NOTE — TOC Initial Note (Signed)
Transition of Care [facility]) - Initial/Assessment Note    Patient Details  Name: Peggy Brown MRN: [id number] Date of Birth: 1955-10-18  Transition of Care [facility]) CM/SW Contact:    Gildardo Griffes, LCSW Phone Number: 05/13/2021, 10:26 AM  Clinical Narrative:                  CSW spoke with patient regarding PT recommendation of SNF at discharge. Patient reports she was short term at [facility] however her daughter does not want her to return. Patient requested CSW call her daughter [name] for discharge planning.   CSW called [name] who reports patient was at [facility] for 12 days for short term rehab, reports she does not want patient to return there. [name] also reports home is not an option, therefore would like CSW to fax out referrals to more facilities for bed offers.   CSW has sent referrals pending bed offers at this time.    Expected Discharge Plan: Skilled Nursing Facility Barriers to Discharge: Continued Medical Work up   Patient Goals and CMS Choice Patient states their goals for this hospitalization and ongoing recovery are:: to go to SNF CMS Medicare.gov Compare Post Acute Care list provided to:: Patient Choice offered to / list presented to : Patient  Expected Discharge Plan and Services Expected Discharge Plan: Skilled Nursing Facility     Post Acute Care Choice: Skilled Nursing Facility Living arrangements for the past 2 months: Skilled Nursing Facility [facility])                                      Prior Living Arrangements/Services Living arrangements for the past 2 months: Skilled Nursing Facility ([place]) Lives with:: Facility Resident Patient language and need for interpreter reviewed:: Yes        Need for Family Participation in Patient Care: Yes (Comment) Care giver support system in place?: Yes (comment)   Criminal Activity/Legal Involvement Pertinent to Current Situation/Hospitalization: No - Comment as needed  Activities of Daily  Living Home Assistive Devices/Equipment: None ADL Screening (condition at time of admission) Patient's cognitive ability adequate to safely complete daily activities?: Yes Is the patient deaf or have difficulty hearing?: No Does the patient have difficulty seeing, even when wearing glasses/contacts?: No Does the patient have difficulty concentrating, remembering, or making decisions?: No Patient able to express need for assistance with ADLs?: Yes Does the patient have difficulty dressing or bathing?: No Independently performs ADLs?: Yes (appropriate for developmental age) Does the patient have difficulty walking or climbing stairs?: Yes Weakness of Legs: Both Weakness of Arms/Hands: Both  Permission Sought/Granted      Share Information with NAME: [name]  Permission granted to share info w AGENCY: SNFs  Permission granted to share info w Relationship: daughter  Permission granted to share info w Contact Information: [phone]  Emotional Assessment   Attitude/Demeanor/Rapport: Gracious Affect (typically observed): Calm Orientation: : Oriented to Self, Oriented to Place, Oriented to  Time, Oriented to Situation Alcohol / Substance Use: Not Applicable Psych Involvement: No (comment)  Admission diagnosis:  Hypomagnesemia [E83.42] SVT (supraventricular tachycardia) (HCC) [I47.1] Patient Active Problem List   Diagnosis Date Noted   Acute diastolic CHF (congestive heart failure) (HCC) 05/11/2021   Anemia of chronic disease    Thrombocytopenia (HCC)    Acute on chronic blood loss anemia 04/26/2021   Hypotension    Alcohol abuse    Hypokalemia  Hypomagnesemia    SVT (supraventricular tachycardia) (HCC) 04/25/2021   Severe protein-calorie malnutrition (HCC) 04/21/2018   Hyponatremia 04/19/2018   PCP:  Patient, No Pcp Per (Inactive) Pharmacy:   [facility] - [place], [place] - [address] [place] [address] Phone:  [phone] Fax: [phone]     Social Determinants of Health (SDOH) Interventions    Readmission Risk Interventions No flowsheet data found.

## 2021-05-13 NOTE — Discharge Summary (Signed)
Physician Discharge Summary  Peggy Brown IEP:[id number] DOB: 1956/09/11 DOA: 05/11/2021  PCP: Patient, No Pcp Per (Inactive)  Admit date: 05/11/2021 Discharge date: 05/13/2021  Admitted From: Home Discharge disposition: SNF   Code Status: Full Code   Discharge Diagnosis:   Principal Problem:   SVT (supraventricular tachycardia) (HCC) Active Problems:   Severe protein-calorie malnutrition (HCC)   Hypomagnesemia   Anemia of chronic disease   Acute diastolic CHF (congestive heart failure) [facility])    Chief Complaint  Patient presents with   Shortness of Breath   Weakness    Brief narrative: Peggy Brown is a 65 y.o. female with PMH significant for SVT, anemia, thrombocytopenia, malnutrition, history of chronic alcoholism. Patient was brought to the ED on 8/9 with complaint of shortness of breath, generalized weakness and fatigue  On arrival, patient was in respiratory distress. EKG showed SVT with rapid narrow complex in the 200+ range. She was given IV adenosine 6 mg as well as Cardizem 5 mg IV after which heart rate slowed down Admitted to hospital service.   Cardiology consultation was obtained.  Subjective: Patient was seen and examined this morning.  Elderly African-American female.  Sitting up in chair.  Not in distress.  Was on low-flow oxygen at 1 L/min.  I weaned her off the oxygen and she did fine with that. Not in physical distress.   Hospital course: SVT -Multifactorial etiology -Converted to sinus rhythm with IV adenosine and IV Cardizem. -Recent echocardiogram 2 months ago with preserved LV function. -TSH normal. -Currently on metoprolol succinate 25 mg daily. Recent Labs    04/25/21 1844 04/26/21 0812 05/11/21 1131  TSH 2.918 2.388 1.628   Acute diastolic dysfunction CHF Acute respiratory failure with hypoxia -Patient's last known LVEF was 55% from July 2020 -She presented for evaluation of shortness of breath and has bilateral lower  extremity swelling -Chest x-ray shows bilateral pleural effusions -Initially received IV Lasix in the ED. -Currently resumed on a lower dose of Lasix.  Continue Lasix at a lower dose of 20 mg at home. -Does not use supplemental oxygen at home.  O2 sat was low at 84% on room air in the ED.  She was started on oxygen by nasal cannula.  She has been weaned off back to room air this morning.  Hypokalemia/hypomagnesemia -Potassium low at 2.6 today.  Oral replacement given.  Magnesium level was low at 1.3 yesterday.  Replacement was given. -Continue daily potassium at home as before. Recent Labs  Lab 05/11/21 1131 05/12/21 0458 05/13/21 0640  K 3.9 3.5 2.6*  MG 1.3*  --   --   PHOS 3.8  --   --     Severe protein calorie malnutrition -Continue low-sodium diet -Add nutritional supplements   Anemia of chronic disease -H&H is stable Recent Labs    04/25/21 1844 04/25/21 2317 04/26/21 0812 04/26/21 1020 04/26/21 2132 04/27/21 0442 04/28/21 0443 04/29/21 0509 05/11/21 1131  HGB 6.8* 5.4* 11.7* 11.7* 11.5* 10.9* 10.6* 10.7* 9.6*    Allergies as of 05/13/2021   No Known Allergies      Medication List     TAKE these medications    acetaminophen 500 MG tablet Commonly known as: TYLENOL Take 1,000 mg by mouth every 8 (eight) hours as needed for moderate pain or mild pain.   feeding supplement Liqd Take 237 mLs by mouth 2 (two) times daily between meals.   furosemide 20 MG tablet Commonly known as: LASIX Take 1 tablet (20 mg total) by  mouth daily. Start taking on: May 14, 2021 What changed: how much to take   guaifenesin 100 MG/5ML syrup Commonly known as: ROBITUSSIN Take 200 mg by mouth every 6 (six) hours as needed for cough.   metoprolol succinate 25 MG 24 hr tablet Commonly known as: TOPROL-XL Take 1 tablet (25 mg total) by mouth daily. Start taking on: May 14, 2021   multivitamin with minerals Tabs tablet Take 1 tablet by mouth daily.   nicotine 14  mg/24hr patch Commonly known as: NICODERM CQ - dosed in mg/24 hours Place 1 patch (14 mg total) onto the skin daily.   pantoprazole 40 MG tablet Commonly known as: Protonix Take 1 tablet (40 mg total) by mouth 2 (two) times daily.   potassium chloride 10 MEQ tablet Commonly known as: KLOR-CON Take 40 mEq by mouth daily.        Discharge Instructions:  Diet Recommendation: Cardiac diet   Follow with Primary MD Patient, No Pcp Per (Inactive) in 7 days   Get CBC/BMP checked in next visit within 1 week by PCP or SNF MD ( we routinely change or add medications that can affect your baseline labs and fluid status, therefore we recommend that you get the mentioned basic workup next visit with your PCP, your PCP may decide not to get them or add new tests based on their clinical decision)  On your next visit with your PCP, please Get Medicines reviewed and adjusted.  Please request your PCP  to go over all Hospital Tests and Procedure/Radiological results at the follow up, please get all Hospital records sent to your Prim MD by signing hospital release before you go home.  Activity: As tolerated with Full fall precautions use walker/cane & assistance as needed  For Heart failure patients - Check your Weight same time everyday, if you gain over 2 pounds, or you develop in leg swelling, experience more shortness of breath or chest pain, call your Primary MD immediately. Follow Cardiac Low Salt Diet and 1.5 lit/day fluid restriction.  If you have smoked or chewed Tobacco in the last 2 yrs please stop smoking, stop any regular Alcohol  and or any Recreational drug use.  If you experience worsening of your admission symptoms, develop shortness of breath, life threatening emergency, suicidal or homicidal thoughts you must seek medical attention immediately by calling 911 or calling your MD immediately  if symptoms less severe.  You Must read complete instructions/literature along with all the  possible adverse reactions/side effects for all the Medicines you take and that have been prescribed to you. Take any new Medicines after you have completely understood and accpet all the possible adverse reactions/side effects.   Do not drive, operate heavy machinery, perform activities at heights, swimming or participation in water activities or provide baby sitting services if your were admitted for syncope or siezures until you have seen by Primary MD or a Neurologist and advised to do so again.  Do not drive when taking Pain medications.  Do not take more than prescribed Pain, Sleep and Anxiety Medications  Wear Seat belts while driving.   Please note You were cared for by a hospitalist during your hospital stay. If you have any questions about your discharge medications or the care you received while you were in the hospital after you are discharged, you can call the unit and asked to speak with the hospitalist on call if the hospitalist that took care of you is not available. Once you are discharged,  your primary care physician will handle any further medical issues. Please note that NO REFILLS for any discharge medications will be authorized once you are discharged, as it is imperative that you return to your primary care physician (or establish a relationship with a primary care physician if you do not have one) for your aftercare needs so that they can reassess your need for medications and monitor your lab values.    Follow ups:    Follow-up Information     [facility] Follow up.   Contact information: [address] [place] [phone]                Wound care:     Discharge Exam:   Vitals:   05/12/21 1923 05/13/21 0253 05/13/21 0747 05/13/21 1203  BP: 135/79 (!) 142/78 (!) 141/82 118/68  Pulse: 80 79 78 88  Resp: 20 20 18 16   Temp: 97.7 F (36.5 C) 97.8 F (36.6 C) (!) 97.4 F (36.3 C) 98.3 F  (36.8 C)  TempSrc:      SpO2: 100% 96% 91% 100%  Weight:      Height:        Body mass index is 17.91 kg/m.  General exam: Pleasant, elderly thin built African-American female Skin: No rashes, lesions or ulcers. HEENT: Atraumatic, normocephalic, no obvious bleeding Lungs: Clear to auscultate bilaterally CVS: Regular rate and rhythm, no murmur GI/Abd soft, nontender, nondistended, bowel sound present CNS: Alert, awake monitor x3 Psychiatry: Mood appropriate Extremities: Trace bilateral pedal edema.  Time coordinating discharge: 35 minutes   The results of significant diagnostics from this hospitalization (including imaging, microbiology, ancillary and laboratory) are listed below for reference.    Procedures and Diagnostic Studies:   DG Chest Portable 1 View  Result Date: 05/11/2021 CLINICAL DATA:  Shortness of breath. EXAM: PORTABLE CHEST 1 VIEW COMPARISON:  April 25, 2021. FINDINGS: The heart size and mediastinal contours are within normal limits. No pneumothorax is noted. Mild bilateral pleural effusions are noted with associated subsegmental atelectasis, left greater than right. The visualized skeletal structures are unremarkable. IMPRESSION: Mild bilateral pleural effusions are noted with associated subsegmental atelectasis, left greater than right. Electronically Signed   By: Lupita RaiderJames  Green Jr M.D.   On: 05/11/2021 12:41     Labs:   Basic Metabolic Panel: Recent Labs  Lab 05/11/21 1131 05/12/21 0458 05/13/21 0640  NA 140 141 139  K 3.9 3.5 2.6*  CL 103 103 100  CO2 28 33* 31  GLUCOSE 132* 85 87  BUN 13 13 13   CREATININE 0.85 0.62 0.49  CALCIUM 8.4* 8.3* 8.2*  MG 1.3*  --   --   PHOS 3.8  --   --    GFR Estimated Creatinine Clearance: 47 mL/min (by C-G formula based on SCr of 0.49 mg/dL). Liver Function Tests: Recent Labs  Lab 05/11/21 1131  AST 30  ALT 16  ALKPHOS 76  BILITOT 0.6  PROT 6.2*  ALBUMIN 2.2*   No results for input(s): LIPASE, AMYLASE in  the last 168 hours. No results for input(s): AMMONIA in the last 168 hours. Coagulation profile No results for input(s): INR, PROTIME in the last 168 hours.  CBC: Recent Labs  Lab 05/11/21 1131  WBC 8.8  NEUTROABS 6.3  HGB 9.6*  HCT 28.2*  MCV 96.2  PLT 289   Cardiac Enzymes: No results for input(s): CKTOTAL, CKMB, CKMBINDEX, TROPONINI in the last 168 hours. BNP: Invalid input(s): POCBNP  CBG: No results for input(s): GLUCAP in the last 168 hours. D-Dimer No results for input(s): DDIMER in the last 72 hours. Hgb A1c No results for input(s): HGBA1C in the last 72 hours. Lipid Profile No results for input(s): CHOL, HDL, LDLCALC, TRIG, CHOLHDL, LDLDIRECT in the last 72 hours. Thyroid function studies Recent Labs    05/11/21 1131  TSH 1.628   Anemia work up No results for input(s): VITAMINB12, FOLATE, FERRITIN, TIBC, IRON, RETICCTPCT in the last 72 hours. Microbiology Recent Results (from the past 240 hour(s))  SARS CORONAVIRUS 2 (TAT 6-24 HRS) Nasopharyngeal Nasopharyngeal Swab     Status: None   Collection Time: 05/11/21 11:31 AM   Specimen: Nasopharyngeal Swab  Result Value Ref Range Status   SARS Coronavirus 2 NEGATIVE NEGATIVE Final    Comment: (NOTE) SARS-CoV-2 target nucleic acids are NOT DETECTED.  The SARS-CoV-2 RNA is generally detectable in upper and lower respiratory specimens during the acute phase of infection. Negative results do not preclude SARS-CoV-2 infection, do not rule out co-infections with other pathogens, and should not be used as the sole basis for treatment or other patient management decisions. Negative results must be combined with clinical observations, patient history, and epidemiological information. The expected result is Negative.  Fact Sheet for Patients: HairSlick.no  Fact Sheet for Healthcare Providers: quierodirigir.com  This test is not yet approved or cleared by the  [place] FDA and  has been authorized for detection and/or diagnosis of SARS-CoV-2 by FDA under an Emergency Use Authorization (EUA). This EUA will remain  in effect (meaning this test can be used) for the duration of the COVID-19 declaration under Se ction 564(b)(1) of the Act, 21 U.S.C. section 360bbb-3(b)(1), unless the authorization is terminated or revoked sooner.  Performed at [facility], [address], [place], [place] [address]      Signed: Lorin Glass  [facility] 05/13/2021, 3:15 PM

## 2021-05-13 NOTE — Progress Notes (Signed)
Patient with critical result, potassium 2.6. MD made aware.

## 2021-05-13 NOTE — NC FL2 (Signed)
[place] MEDICAID FL2 LEVEL OF CARE SCREENING TOOL     IDENTIFICATION  Patient Name: Peggy [facility] Birthdate: 05-Jan-1956 Sex: female Admission Date (Current Location): 05/11/2021  [facility] and [place] Number:  Chiropodist and Address:  [facility], [address] [place], [place] [address]      Provider Number: [id number]  Attending Physician Name and Address:  Lorin Glass, MD  Relative Name and Phone Number:  [name] (daughter) [phone]    Current Level of Care: Hospital Recommended Level of Care: Skilled Nursing Facility Prior Approval Number:    Date Approved/Denied:   PASRR Number: [id number]  Discharge Plan: SNF    Current Diagnoses: Patient Active Problem List   Diagnosis Date Noted   Acute diastolic CHF (congestive heart failure) (HCC) 05/11/2021   Anemia of chronic disease    Thrombocytopenia (HCC)    Acute on chronic blood loss anemia 04/26/2021   Hypotension    Alcohol abuse    Hypokalemia    Hypomagnesemia    SVT (supraventricular tachycardia) (HCC) 04/25/2021   Severe protein-calorie malnutrition (HCC) 04/21/2018   Hyponatremia 04/19/2018    Orientation RESPIRATION BLADDER Height & Weight     Self, Time, Situation, Place  O2 (3L nasal cannula) Continent, External catheter Weight: 94 lb 12.8 oz (43 kg) Height:  5\' 1"  (154.9 cm)  BEHAVIORAL SYMPTOMS/MOOD NEUROLOGICAL BOWEL NUTRITION STATUS      Continent Diet (see discharge summary)  AMBULATORY STATUS COMMUNICATION OF NEEDS Skin   Limited Assist Verbally Normal                       Personal Care Assistance Level of Assistance    Bathing Assistance: Limited assistance Feeding assistance: Independent Dressing Assistance: Limited assistance Total Care Assistance: Limited assistance   Functional Limitations Info  Sight, Hearing, Speech Sight Info: Adequate Hearing Info: Adequate Speech Info: Adequate    SPECIAL CARE FACTORS FREQUENCY  PT (By  licensed PT), OT (By licensed OT)     PT Frequency: min 4x weekly OT Frequency: min 4x weekly            Contractures Contractures Info: Not present    Additional Factors Info  Code Status, Allergies Code Status Info: Full Allergies Info: No KNonw Allergies           Current Medications (05/13/2021):  This is the current hospital active medication list Current Facility-Administered Medications  Medication Dose Route Frequency Provider Last Rate Last Admin   0.9 %  sodium chloride infusion  250 mL Intravenous PRN Agbata, Tochukwu, MD       acetaminophen (TYLENOL) tablet 1,000 mg  1,000 mg Oral Q8H PRN Agbata, Tochukwu, MD       diphenhydrAMINE (BENADRYL) capsule 25 mg  25 mg Oral Q6H PRN Mansy, Jan A, MD   25 mg at 05/13/21 0223   enoxaparin (LOVENOX) injection 40 mg  40 mg Subcutaneous Q24H Agbata, Tochukwu, MD   40 mg at 05/12/21 2052   feeding supplement (ENSURE ENLIVE / ENSURE PLUS) liquid 237 mL  237 mL Oral BID BM Agbata, Tochukwu, MD   237 mL at 05/13/21 1031   furosemide (LASIX) tablet 20 mg  20 mg Oral Daily 07/13/21, MD   20 mg at 05/13/21 1031   guaiFENesin (ROBITUSSIN) 100 MG/5ML solution 200 mg  200 mg Oral Q6H PRN Agbata, Tochukwu, MD       metoprolol succinate (TOPROL-XL) 24 hr tablet 25 mg  25 mg Oral Daily  Lucile Shutters, MD   25 mg at 05/13/21 1031   nicotine (NICODERM CQ - dosed in mg/24 hours) patch 14 mg  14 mg Transdermal Daily Agbata, Tochukwu, MD   14 mg at 05/13/21 1030   ondansetron (ZOFRAN) injection 4 mg  4 mg Intravenous Q6H PRN Agbata, Tochukwu, MD       pantoprazole (PROTONIX) EC tablet 40 mg  40 mg Oral BID Agbata, Tochukwu, MD   40 mg at 05/13/21 1031   [START ON 05/14/2021] potassium chloride (KLOR-CON) CR tablet 40 mEq  40 mEq Oral Daily Dahal, Binaya, MD       potassium chloride SA (KLOR-CON) CR tablet 40 mEq  40 mEq Oral Q2H Dahal, Binaya, MD   40 mEq at 05/13/21 1031   sodium chloride flush (NS) 0.9 % injection 3 mL  3 mL Intravenous  Q12H Agbata, Tochukwu, MD   3 mL at 05/13/21 1032   sodium chloride flush (NS) 0.9 % injection 3 mL  3 mL Intravenous PRN Agbata, Tochukwu, MD         Discharge Medications: Please see discharge summary for a list of discharge medications.  Relevant Imaging Results:  Relevant Lab Results:   Additional Information SSN: [id number]  Gildardo Griffes, LCSW

## 2021-05-13 NOTE — TOC Progression Note (Signed)
Transition of Care [facility]) - Progression Note    Patient Details  Name: Peggy [facility] MRN: [id number] Date of Birth: November 06, 1955  Transition [facility] CM/SW Contact  Gildardo Griffes, [place] Phone Number: 05/13/2021, 2:03 PM  Clinical Narrative:     Patient daughter [name] provided bed offers, [name] chooses [facility]. Patient informed. CSW has reached out to [facility] to inform of bed acceptance. Pending response at this time if they can take patient tomorrow.   Expected Discharge Plan: Skilled Nursing Facility Barriers to Discharge: Continued Medical Work up  Expected Discharge Plan and Services Expected Discharge Plan: Skilled Nursing Facility     Post Acute Care Choice: Skilled Nursing Facility Living arrangements for the past 2 months: Skilled Nursing Facility [facility])                                       Social Determinants of Health (SDOH) Interventions    Readmission Risk Interventions No flowsheet data found.

## 2021-05-13 NOTE — Progress Notes (Signed)
Initial Nutrition Assessment  DOCUMENTATION CODES:  Underweight, Severe malnutrition in context of social or environmental circumstances  INTERVENTION:  Continue current diet as ordered, encourage PO intake. Increase Ensure Enlive po to TID, each supplement provides 350 kcal and 20 grams of protein  NUTRITION DIAGNOSIS:  Severe Malnutrition related to social / environmental circumstances as evidenced by severe muscle depletion, severe fat depletion.  GOAL:  Patient will meet greater than or equal to 90% of their needs  MONITOR:  PO intake, Supplement acceptance, Labs, Weight trends  REASON FOR ASSESSMENT:  Malnutrition Screening Tool    ASSESSMENT:  65 y.o. female with medical history significant for severe protein calorie malnutrition, CHF, and history of alcohol abuse brought to ER from facility for evaluation of SOB and palpitations.  Pt resting in bedside chair at the time of assessment. Pt reports that appetite is ok, not eating as much as she does at baseline, but feels appetite is improving. Pt states that she likes the ensure she is receiving and is routinely drinking two each day, agreeable to TID. Noted pt has several snacks at bedside and a chick-fil-a sandwich.  Very thin on exam with significant muscle and fat deficits. Pt is unsure of weight loss, states she hasn't been paying much attention but that she generally stays around 100 lbs.    Average Meal Intake: 8/9-8/11: 15% intake x 2 recorded meal  Nutritionally Relevant Medications: Scheduled Meds:  feeding supplement  237 mL Oral BID BM   furosemide  20 mg Oral Daily   pantoprazole  40 mg Oral BID   [START ON 05/14/2021] potassium chloride  40 mEq Oral Daily   Continuous Infusions:  sodium chloride     PRN Meds: diphenhydrAMINE, ondansetron  Labs Reviewed: K 2.6  NUTRITION - FOCUSED PHYSICAL EXAM: Flowsheet Row Most Recent Value  Orbital Region Severe depletion  Upper Arm Region Severe depletion   Thoracic and Lumbar Region Severe depletion  Buccal Region Moderate depletion  Temple Region Severe depletion  Clavicle Bone Region Severe depletion  Clavicle and Acromion Bone Region Severe depletion  Scapular Bone Region Severe depletion  Dorsal Hand Severe depletion  Patellar Region Severe depletion  Anterior Thigh Region Severe depletion  Posterior Calf Region Severe depletion  Edema (RD Assessment) Moderate  [pitting edema - BLE]  Hair Reviewed  Eyes Reviewed  Mouth Reviewed  Skin Reviewed  Nails Reviewed   Diet Order:   Diet Order             Diet - low sodium heart healthy           Diet 2 gram sodium Room service appropriate? Yes; Fluid consistency: Thin  Diet effective now                  EDUCATION NEEDS:  No education needs have been identified at this time  Skin:  Skin Assessment: Reviewed RN Assessment (redness to the sacrum)  Last BM:  8/10 - type 4  Height:  Ht Readings from Last 1 Encounters:  05/12/21 5\' 1"  (1.549 m)   Weight:  Wt Readings from Last 1 Encounters:  05/12/21 43 kg    Ideal Body Weight:  47.7 kg  BMI:  Body mass index is 17.91 kg/m.  Estimated Nutritional Needs:  Kcal:  1500-1700 kcal/d Protein:  75-85 g/d Fluid:  >1581mL/d   80m, RD, LDN Clinical Dietitian Pager on Amion

## 2021-05-13 NOTE — Evaluation (Signed)
Physical Therapy Evaluation Patient Details Name: [facility] MRN: [id number] DOB: 01-Aug-1956 Today's Date: 05/13/2021   History of Present Illness  presented to ER secondary to SOB, heart palpitations; admitted for management of SVT, afib, converted to NSR with adenosine and cardizem.  Clinical Impression  Patient resting in bed upon arrival to room; alert and oriented, agreeable to session.  Endorses generalized soreness in back/legs (due to bed and position), and reports chronic pain in bilat feet (callouses noted lateral border plantar surfaces of bilat feet).  Generally weak and deconditioned throughout all extremities (4- to 4/5) with mod edema noted mid-calf distally bilat LEs.  Currently requiring supervision for bed mobility; min assist for sit/stand, basic transfers and gait (25') with RW.  Demonstrates forward flexed posture, mod WBing on RW; decreased step height/length with flat foot contact.  Decreased balance reactions; unsafe to attempt without RW and +1 assist at all times. Of note, patient HR 90-100s throughout session; no reports of pain, pressure or palpitations with activities. Does require 2L supplemental O2 to maintain sats with exertion at this time. Would benefit from skilled PT to address above deficits and promote optimal return to PLOF.; recommend transition to STR upon discharge from acute hospitalization.  SaO2 on room air at rest = 85% SaO2 on 2L at rest = 94% SaO2 on 2 liters of O2 while ambulating = 94%     Follow Up Recommendations SNF    Equipment Recommendations  Rolling walker with 5" wheels    Recommendations for Other Services       Precautions / Restrictions Precautions Precautions: Fall Precaution Comments: monitor HR      Mobility  Bed Mobility Overal bed mobility: Modified Independent Bed Mobility: Supine to Sit Rolling: Supervision         General bed mobility comments: increased time/effort to complete; self-assisting LEs out  of bed as needed    Transfers Overall transfer level: Needs assistance Equipment used: Rolling walker (2 wheeled) Transfers: Sit to/from Stand Sit to Stand: Min assist         General transfer comment: requires UE support to assist with lift off and stabilize once up  Ambulation/Gait   Gait Distance (Feet): 25 Feet Assistive device: Rolling walker (2 wheeled)       General Gait Details: forward flexed posture, mod WBing on RW; decreased step height/length with flat foot contact.  Decreased balance reactions; unsafe to attempt without RW and +1 assist at all [employer]    Modified Rankin (Stroke Patients Only)       Balance Overall balance assessment: Needs assistance Sitting-balance support: No upper extremity supported;Feet supported Sitting balance-Leahy Scale: Fair     Standing balance support: Bilateral upper extremity supported Standing balance-Leahy Scale: Poor Standing balance comment: +1 and RW at all times                             Pertinent Vitals/Pain Pain Assessment: Faces Faces Pain Scale: Hurts little more Pain Location: bilat feet, plantar surfaces (callouses on lateral border of feet) Pain Descriptors / Indicators: Sore Pain Intervention(s): Limited activity within patient's tolerance;Monitored during session;Repositioned    Home Living Family/patient expects to be discharged to:: Skilled nursing facility Living Arrangements: Children Available Help at Discharge: Family;Available PRN/intermittently Type of Home: House Home Access: Level entry     Home Layout: One level Home Equipment: None Additional  Comments: Was at [facility] for rehab immediately prior to admission; hopes to return home at discharge    Prior Function Level of Independence: Independent         Comments: Indep with ADLs, household and community mobilization without assist device; no home O2; denies fall history.   Daughter/son-in-law assist with meals and household chores.     Hand Dominance   Dominant Hand: Right    Extremity/Trunk Assessment   Upper Extremity Assessment Upper Extremity Assessment: Generalized weakness (grossly 4/5 throughout; mild/mod tremors at times)    Lower Extremity Assessment Lower Extremity Assessment: Generalized weakness (grossly 4/5 throughout)       Communication   Communication: No difficulties  Cognition Arousal/Alertness: Awake/alert Behavior During Therapy: WFL for tasks assessed/performed Overall Cognitive Status: Within Functional Limits for tasks assessed                                 General Comments: Alert and oriented to basic information, follows commands, pleasant and cooperative      General Comments      Exercises Other Exercises Other Exercises: Toilet transfer, SPT with RW, min assist; sit/stand from [facility] with RW, min assist; standing balance for hygiene, peri-care, min assist.  Generally tremulous and unsteady   Assessment/Plan    PT Assessment Patient needs continued PT services  PT Problem List Decreased strength;Decreased activity tolerance;Decreased balance;Decreased mobility;Decreased coordination;Decreased knowledge of use of DME;Cardiopulmonary status limiting activity       PT Treatment Interventions DME instruction;Gait training;Functional mobility training;Therapeutic activities;Therapeutic exercise;Balance training;Neuromuscular re-education;Patient/family education    PT Goals (Current goals can be found in the Care Plan section)  Acute Rehab PT Goals Patient Stated Goal: to return home with her daughter PT Goal Formulation: With patient Time For Goal Achievement: 05/27/21 Potential to Achieve Goals: Good    Frequency Min 2X/week   Barriers to discharge Decreased caregiver support      Co-evaluation               AM-PAC PT "6 Clicks" Mobility  Outcome Measure Help needed turning from  your back to your side while in a flat bed without using bedrails?: None Help needed moving from lying on your back to sitting on the side of a flat bed without using bedrails?: None Help needed moving to and from a bed to a chair (including a wheelchair)?: A Little Help needed standing up from a chair using your arms (e.g., wheelchair or bedside chair)?: A Little Help needed to walk in hospital room?: A Little Help needed climbing 3-5 steps with a railing? : A Little 6 Click Score: 20    End of Session Equipment Utilized During Treatment: Gait belt Activity Tolerance: Patient tolerated treatment well Patient left: in chair;with call bell/phone within reach;with chair alarm set Nurse Communication: Mobility status PT Visit Diagnosis: Unsteadiness on feet (R26.81);Muscle weakness (generalized) (M62.81);Difficulty in walking, not elsewhere classified (R26.2)    Time: 2876-8115     Charges:   PT Evaluation $PT Eval Moderate Complexity: 1 Mod PT Treatments $Therapeutic Activity: 8-22 mins       Jazzlyn Huizenga H. Manson Passey, PT, DPT, NCS 05/13/21, 10:08 AM [phone]

## 2021-05-26 ENCOUNTER — Ambulatory Visit: Payer: Medicare Other | Admitting: Family

## 2021-06-02 ENCOUNTER — Ambulatory Visit: Payer: Medicare Other | Admitting: Family

## 2021-06-02 ENCOUNTER — Telehealth: Payer: Self-pay | Admitting: Family

## 2021-06-02 NOTE — Telephone Encounter (Signed)
Patient did not show for her [facility] Failure [facility] appointment on 06/02/21. Will attempt to reschedule.   

## 2021-06-09 ENCOUNTER — Telehealth: Payer: Self-pay | Admitting: Internal Medicine

## 2021-06-09 NOTE — Telephone Encounter (Signed)
Home Health Verbal Orders - Caller/Agency: Marthe Patch- [facility]  Callback Number: [phone]  Requesting OT/PT/Skilled Nursing/Social Work/Speech Therapy: Nursing  Frequency: 1w9, 2prn

## 2021-06-10 NOTE — Telephone Encounter (Signed)
We cannot give verbal orders for a patient who has not established here.

## 2021-06-10 NOTE — Telephone Encounter (Signed)
We cannot give verbal orders for a patient that is not established yet correct?

## 2021-06-11 ENCOUNTER — Telehealth: Payer: Self-pay | Admitting: Internal Medicine

## 2021-06-11 NOTE — Telephone Encounter (Signed)
Spoke to Lorri from [employer] and notified her that we cannot give verbal orders on a patient who isn't established with our office. Lorri verbalized understanding and has no further questions at time.

## 2021-06-11 NOTE — Telephone Encounter (Signed)
Peggy Brown  OT with [facility] is aware pt does not have an appt with dr Charlotta Newton until 07-21-2021 and will need verbal orders esther will callback in oct to confirm the pt still needs OT

## 2021-06-25 ENCOUNTER — Other Ambulatory Visit: Payer: Self-pay

## 2021-06-25 ENCOUNTER — Encounter: Payer: Self-pay | Admitting: Family

## 2021-06-25 ENCOUNTER — Other Ambulatory Visit
Admission: RE | Admit: 2021-06-25 | Discharge: 2021-06-25 | Disposition: A | Discharge status: Home / Self Care | Payer: Medicare Other | Source: Ambulatory Visit | Attending: Family | Admitting: Family

## 2021-06-25 ENCOUNTER — Ambulatory Visit: Payer: Medicare Other | Attending: Family | Admitting: Family

## 2021-06-25 VITALS — BP 143/70 | HR 73 | Resp 18 | Ht 61.0 in | Wt 82.2 lb

## 2021-06-25 DIAGNOSIS — I471 Supraventricular tachycardia: Secondary | ICD-10-CM | POA: Insufficient documentation

## 2021-06-25 DIAGNOSIS — Z720 Tobacco use: Secondary | ICD-10-CM | POA: Diagnosis not present

## 2021-06-25 DIAGNOSIS — R5383 Other fatigue: Secondary | ICD-10-CM | POA: Insufficient documentation

## 2021-06-25 DIAGNOSIS — I5032 Chronic diastolic (congestive) heart failure: Secondary | ICD-10-CM | POA: Insufficient documentation

## 2021-06-25 DIAGNOSIS — D638 Anemia in other chronic diseases classified elsewhere: Secondary | ICD-10-CM

## 2021-06-25 DIAGNOSIS — M25569 Pain in unspecified knee: Secondary | ICD-10-CM | POA: Diagnosis not present

## 2021-06-25 DIAGNOSIS — D649 Anemia, unspecified: Secondary | ICD-10-CM | POA: Diagnosis not present

## 2021-06-25 DIAGNOSIS — Z8249 Family history of ischemic heart disease and other diseases of the circulatory system: Secondary | ICD-10-CM | POA: Insufficient documentation

## 2021-06-25 DIAGNOSIS — R059 Cough, unspecified: Secondary | ICD-10-CM | POA: Diagnosis not present

## 2021-06-25 DIAGNOSIS — F17210 Nicotine dependence, cigarettes, uncomplicated: Secondary | ICD-10-CM | POA: Diagnosis not present

## 2021-06-25 DIAGNOSIS — R0602 Shortness of breath: Secondary | ICD-10-CM | POA: Diagnosis not present

## 2021-06-25 DIAGNOSIS — Z7901 Long term (current) use of anticoagulants: Secondary | ICD-10-CM | POA: Insufficient documentation

## 2021-06-25 DIAGNOSIS — Z79899 Other long term (current) drug therapy: Secondary | ICD-10-CM | POA: Diagnosis not present

## 2021-06-25 LAB — BASIC METABOLIC PANEL
Anion gap: 9 (ref 5–15)
BUN: 7 mg/dL — ABNORMAL LOW (ref 8–23)
CO2: 27 mmol/L (ref 22–32)
Calcium: 9.2 mg/dL (ref 8.9–10.3)
Chloride: 90 mmol/L — ABNORMAL LOW (ref 98–111)
Creatinine, Ser: 0.45 mg/dL (ref 0.44–1.00)
GFR, Estimated: >60 mL/min (ref >60)
Glucose, Bld: 109 mg/dL — ABNORMAL HIGH (ref 70–99)
Potassium: 4.3 mmol/L (ref 3.5–5.1)
Sodium: 126 mmol/L — ABNORMAL LOW (ref 135–145)

## 2021-06-25 NOTE — Patient Instructions (Addendum)
Continue weighing daily and call for an overnight weight gain of > 2 pounds or a weekly weight gain of >5 pounds.   Call Dr. Lady Gary with [facility]  to schedule hospital follow up appointment as soon as possible.   Harold Hedge, MD [facility]  [address]  [place], [place] [address] Phone #: [phone]

## 2021-06-25 NOTE — Progress Notes (Signed)
Patient ID: Peggy Brown, female    DOB: October 27, 1955, 65 y.o.   MRN: [id number]  HPI  Ms Peggy Brown is a 65 y/o female with a history of anemia, SVT, current tobacco use and chronic heart failure.   Echo report from 04/28/21 reviewed and showed an EF of 55% along with mild MR without LVH.   Admitted 05/11/21 due to shortness of breath, weakness and fatigue. Was in SVT and given adenosine as well as IV cardizem. Cardiology consult obtained. Weaned off oxygen. Initially given IV lasix with transition to oral diuretics. Hypokalemia replaced.  Discharged after 2 days.   She presents today for her initial visit with a chief complaint of minimal fatigue upon moderate exertion. She describes this as having been present for several months. She has associated dry cough, shortness of breath and knee pain along with this. She denies any difficulty sleeping, dizziness, abdominal distention, palpitations, pedal edema and chest pain.   Does not have scales so hasn't been weighing herself daily. Is drinking 2-3 cans of Ensure due to variability in her appetite.   Past Medical History:  Diagnosis Date   Anemia    CHF (congestive heart failure) (HCC)    SVT (supraventricular tachycardia) (HCC)    Past Surgical History:  Procedure Laterality Date   CESAREAN SECTION     COLONOSCOPY N/A 04/27/2021   Procedure: COLONOSCOPY;  Surgeon: Peggy Bill, MD;  Location: [facility] ENDOSCOPY;  Service: Endoscopy;  Laterality: N/A;   ESOPHAGOGASTRODUODENOSCOPY N/A 04/27/2021   Procedure: ESOPHAGOGASTRODUODENOSCOPY (EGD);  Surgeon: Peggy Bill, MD;  Location: [facility];  Service: Endoscopy;  Laterality: N/A;   Family History  Problem Relation Age of Onset   Hypertension Mother    Thyroid cancer Mother    COPD Father    Social History   Tobacco Use   Smoking status: Every Day    Packs/day: 0.50    Years: 45.00    Pack years: 22.50    Types: Cigarettes   Smokeless tobacco: Never  Substance Use  Topics   Alcohol use: Yes    Alcohol/week: 2.0 standard drinks    Types: 2 Cans of beer per week   No Known Allergies Prior to Admission medications   Medication Sig Start Date End Date Taking? Authorizing Provider  acetaminophen (TYLENOL) 500 MG tablet Take 1,000 mg by mouth every 8 (eight) hours as needed for moderate pain or mild pain.   Yes [provider]  feeding supplement (ENSURE ENLIVE / ENSURE PLUS) LIQD Take 237 mLs by mouth 2 (two) times daily between meals. 04/30/21  Yes Peggy Coy, MD  furosemide (LASIX) 20 MG tablet Take 1 tablet (20 mg total) by mouth daily. 05/14/21  Yes Dahal, Melina Schools, MD  metoprolol succinate (TOPROL-XL) 25 MG 24 hr tablet Take 1 tablet (25 mg total) by mouth daily. 05/14/21  Yes Dahal, Melina Schools, MD  Multiple Vitamin (MULTIVITAMIN WITH MINERALS) TABS tablet Take 1 tablet by mouth daily. 05/13/21  Yes Dahal, Melina Schools, MD  pantoprazole (PROTONIX) 40 MG tablet Take 1 tablet (40 mg total) by mouth 2 (two) times daily. 04/30/21  Yes Peggy Coy, MD  potassium chloride SA (KLOR-CON) 20 MEQ tablet Take 40 mEq by mouth daily. Take 2 tablets once daily   Yes [provider]  nicotine (NICODERM CQ - DOSED IN MG/24 HOURS) 14 mg/24hr patch Place 1 patch (14 mg total) onto the skin daily. Patient not taking: Reported on 06/25/2021 05/01/21   Peggy Coy, MD    Review of Systems  Constitutional:  Positive for fatigue. Negative for appetite change.  HENT:  Negative for congestion, postnasal drip and sore throat.   Eyes: Negative.   Respiratory:  Positive for cough (dry cough) and shortness of breath. Negative for chest tightness.   Cardiovascular:  Negative for chest pain, palpitations and leg swelling.  Gastrointestinal:  Negative for abdominal distention and abdominal pain.  Endocrine: Negative.   Genitourinary: Negative.   Musculoskeletal:  Positive for arthralgias (knee pain). Negative for back pain.  Skin: Negative.   Allergic/Immunologic: Negative.    Neurological:  Negative for dizziness and light-headedness.  Hematological:  Negative for adenopathy. Does not bruise/bleed easily.  Psychiatric/Behavioral:  Negative for dysphoric mood and sleep disturbance (sleeping on 1 pillow). The patient is not nervous/anxious.    Vitals:   06/25/21 1337  BP: (!) 143/70  Pulse: 73  Resp: 18  SpO2: 100%  Weight: 82 lb 4 oz (37.3 kg)  Height: 5\' 1"  (1.549 m)   Wt Readings from Last 3 Encounters:  06/25/21 82 lb 4 oz (37.3 kg)  05/12/21 94 lb 12.8 oz (43 kg)  10/21/19 110 lb (49.9 kg)   Lab Results  Component Value Date   CREATININE 0.49 05/13/2021   CREATININE 0.62 05/12/2021   CREATININE 0.85 05/11/2021   Physical Exam Vitals and nursing note reviewed.  Constitutional:      Appearance: Normal appearance.  HENT:     Head: Normocephalic and atraumatic.  Cardiovascular:     Rate and Rhythm: Normal rate and regular rhythm.  Pulmonary:     Effort: Pulmonary effort is normal. No respiratory distress.     Breath sounds: No wheezing or rales.  Abdominal:     General: There is no distension.     Palpations: Abdomen is soft.  Musculoskeletal:        General: No tenderness.     Cervical back: Normal range of motion and neck supple.     Right lower leg: No edema.     Left lower leg: No edema.  Skin:    General: Skin is warm and dry.  Neurological:     General: No focal deficit present.     Mental Status: She is alert and oriented to person, place, and time.  Psychiatric:        Mood and Affect: Mood normal.        Behavior: Behavior normal.        Thought Content: Thought content normal.   Assessment & Plan:  1: Chronic heart failure with preserved ejection fraction without structural changes- - NYHA class II - euvolemic today - scales given today and she was instructed to weigh daily and call for an overnight weight gain of > 2 pounds or a weekly weight gain of > 5 pounds - patient weighed 105 pounds upon [facility] to the  [facility] on [facility], now down to 82.4 pounds - drinking 2-3 cans of ensure daily - no longer adding salt to her food since her recent admission; reviewed the importance of looking at food labels and a low sodium cookbook was provided - BNP 04/28/21 was 212.6  2: SVT- - saw cardiology (Fath) during recent admission; currently does not have f/u scheduled; contact information provided on her AVS so that her daughter (who arranges the appointments) can call and schedule the appointment - BMP 05/13/21 reviewed and showed sodium 139, potassium 2.6, creatinine 0.49 and GFR >60 - check BMP today; she is currently taking 07/13/21 potassium daily  3: Anemia- - sees GI (  Vigg) 07/21/21 - hemoglobin 05/11/21 was 9.6  4: Tobacco use- - smoking < 1 ppd of cigarettes; lives with her daughter and has to now go outside the home to smoke - complete cessation discussed for 3 minutes with her   Medication bottles reviewed.   Return in 6 weeks or sooner for any questions/problems before then.

## 2021-06-28 ENCOUNTER — Telehealth: Payer: Self-pay

## 2021-06-28 NOTE — Telephone Encounter (Addendum)
Patient's daughter and caregiver, [name], answered patient's phone. She states she handles her mom's medications because of her mom's memory loss and confusion. She was notified of the below lab results and medication changes that need to be made.  She was able to verbalize back to me the change to furosemide as needed and potassium decrease to 1 tablet a day.  Informed daughter to call us if there were any problems or questions. Suanne Marker, RN [facility] ----- Message from Delma Freeze, FNP sent at 06/28/2021  9:57 AM EDT ----- Kidney function and potassium are normal. Sodium is low so she needs to take her furosemide only if she needs it for weight gain, swelling or shortness of breath (was taking it every day). Since she will take her furosemide PRN now, she also needs to decrease her potassium to 1 tablet daily (was taking 2 daily)

## 2021-07-03 ENCOUNTER — Other Ambulatory Visit: Payer: Self-pay

## 2021-07-03 ENCOUNTER — Emergency Department: Payer: Medicare Other

## 2021-07-03 ENCOUNTER — Inpatient Hospital Stay
Admission: EM | Admit: 2021-07-03 | Discharge: 2021-07-06 | DRG: 640 | Disposition: A | Discharge status: Home Health | Payer: Medicare Other | Attending: Family Medicine | Admitting: Family Medicine

## 2021-07-03 DIAGNOSIS — E871 Hypo-osmolality and hyponatremia: Principal | ICD-10-CM | POA: Diagnosis present

## 2021-07-03 DIAGNOSIS — E43 Unspecified severe protein-calorie malnutrition: Secondary | ICD-10-CM | POA: Diagnosis present

## 2021-07-03 DIAGNOSIS — R0789 Other chest pain: Secondary | ICD-10-CM

## 2021-07-03 DIAGNOSIS — Z681 Body mass index (BMI) 19 or less, adult: Secondary | ICD-10-CM

## 2021-07-03 DIAGNOSIS — F03A0 Unspecified dementia, mild, without behavioral disturbance, psychotic disturbance, mood disturbance, and anxiety: Secondary | ICD-10-CM | POA: Diagnosis present

## 2021-07-03 DIAGNOSIS — F17210 Nicotine dependence, cigarettes, uncomplicated: Secondary | ICD-10-CM | POA: Diagnosis present

## 2021-07-03 DIAGNOSIS — Z8719 Personal history of other diseases of the digestive system: Secondary | ICD-10-CM

## 2021-07-03 DIAGNOSIS — R531 Weakness: Secondary | ICD-10-CM

## 2021-07-03 DIAGNOSIS — G9341 Metabolic encephalopathy: Secondary | ICD-10-CM | POA: Diagnosis present

## 2021-07-03 DIAGNOSIS — R64 Cachexia: Secondary | ICD-10-CM | POA: Diagnosis present

## 2021-07-03 DIAGNOSIS — Z808 Family history of malignant neoplasm of other organs or systems: Secondary | ICD-10-CM | POA: Diagnosis not present

## 2021-07-03 DIAGNOSIS — Z20822 Contact with and (suspected) exposure to covid-19: Secondary | ICD-10-CM | POA: Diagnosis present

## 2021-07-03 DIAGNOSIS — R0602 Shortness of breath: Secondary | ICD-10-CM | POA: Diagnosis not present

## 2021-07-03 DIAGNOSIS — F1020 Alcohol dependence, uncomplicated: Secondary | ICD-10-CM | POA: Diagnosis present

## 2021-07-03 DIAGNOSIS — D638 Anemia in other chronic diseases classified elsewhere: Secondary | ICD-10-CM | POA: Diagnosis present

## 2021-07-03 DIAGNOSIS — R636 Underweight: Secondary | ICD-10-CM

## 2021-07-03 DIAGNOSIS — I5032 Chronic diastolic (congestive) heart failure: Secondary | ICD-10-CM | POA: Diagnosis present

## 2021-07-03 DIAGNOSIS — R079 Chest pain, unspecified: Secondary | ICD-10-CM | POA: Diagnosis present

## 2021-07-03 LAB — CBC WITH DIFFERENTIAL/PLATELET
Abs Immature Granulocytes: 0.04 10*3/uL (ref 0.00–0.07)
Basophils Absolute: 0.1 10*3/uL (ref 0.0–0.1)
Basophils Relative: 0 %
Eosinophils Absolute: 0.1 10*3/uL (ref 0.0–0.5)
Eosinophils Relative: 1 %
HCT: 29.9 % — ABNORMAL LOW (ref 36.0–46.0)
Hemoglobin: 11.1 g/dL — ABNORMAL LOW (ref 12.0–15.0)
Immature Granulocytes: 0 %
Lymphocytes Relative: 34 %
Lymphs Abs: 3.9 10*3/uL (ref 0.7–4.0)
MCH: 31.9 pg (ref 26.0–34.0)
MCHC: 37.1 g/dL — ABNORMAL HIGH (ref 30.0–36.0)
MCV: 85.9 fL (ref 80.0–100.0)
Monocytes Absolute: 1.1 10*3/uL — ABNORMAL HIGH (ref 0.1–1.0)
Monocytes Relative: 10 %
Neutro Abs: 6.2 10*3/uL (ref 1.7–7.7)
Neutrophils Relative %: 55 %
Platelets: 294 10*3/uL (ref 150–400)
RBC: 3.48 MIL/uL — ABNORMAL LOW (ref 3.87–5.11)
RDW: 13 % (ref 11.5–15.5)
WBC: 11.3 10*3/uL — ABNORMAL HIGH (ref 4.0–10.5)
nRBC: 0 % (ref 0.0–0.2)

## 2021-07-03 LAB — COMPREHENSIVE METABOLIC PANEL
ALT: 14 U/L (ref 0–44)
AST: 27 U/L (ref 15–41)
Albumin: 3.2 g/dL — ABNORMAL LOW (ref 3.5–5.0)
Alkaline Phosphatase: 125 U/L (ref 38–126)
Anion gap: 12 (ref 5–15)
BUN: 8 mg/dL (ref 8–23)
CO2: 23 mmol/L (ref 22–32)
Calcium: 9 mg/dL (ref 8.9–10.3)
Chloride: 82 mmol/L — ABNORMAL LOW (ref 98–111)
Creatinine, Ser: 0.41 mg/dL — ABNORMAL LOW (ref 0.44–1.00)
GFR, Estimated: >60 mL/min (ref >60)
Glucose, Bld: 97 mg/dL (ref 70–99)
Potassium: 3.7 mmol/L (ref 3.5–5.1)
Sodium: 117 mmol/L — CL (ref 135–145)
Total Bilirubin: 0.7 mg/dL (ref 0.3–1.2)
Total Protein: 6.6 g/dL (ref 6.5–8.1)

## 2021-07-03 LAB — OSMOLALITY: Osmolality: 243 mOsm/kg — CL (ref 275–295)

## 2021-07-03 LAB — BRAIN NATRIURETIC PEPTIDE: B Natriuretic Peptide: 12.8 pg/mL (ref 0.0–100.0)

## 2021-07-03 LAB — RESP PANEL BY RT-PCR (FLU A&B, COVID) ARPGX2
Influenza A by PCR: NEGATIVE
Influenza B by PCR: NEGATIVE
SARS Coronavirus 2 by RT PCR: NEGATIVE

## 2021-07-03 LAB — TROPONIN I (HIGH SENSITIVITY): Troponin I (High Sensitivity): 7 ng/L (ref <18)

## 2021-07-03 MED ORDER — METOPROLOL SUCCINATE ER 25 MG PO TB24
25.0000 mg | ORAL_TABLET | Freq: Every day | ORAL | Status: DC
  Administered 2021-07-04 – 2021-07-06 (×3): 25 mg via ORAL
  Filled 2021-07-03 (×3): qty 1

## 2021-07-03 MED ORDER — ACETAMINOPHEN 325 MG PO TABS
650.0000 mg | ORAL_TABLET | Freq: Four times a day (QID) | ORAL | Status: DC | PRN
  Administered 2021-07-04 – 2021-07-05 (×4): 650 mg via ORAL
  Filled 2021-07-03 (×4): qty 2

## 2021-07-03 MED ORDER — PANTOPRAZOLE SODIUM 40 MG PO TBEC
40.0000 mg | DELAYED_RELEASE_TABLET | Freq: Two times a day (BID) | ORAL | Status: DC
  Administered 2021-07-04 – 2021-07-06 (×6): 40 mg via ORAL
  Filled 2021-07-03 (×6): qty 1

## 2021-07-03 MED ORDER — FUROSEMIDE 20 MG PO TABS: 20.0000 mg | ORAL_TABLET | Freq: Every day | ORAL | Status: DC

## 2021-07-03 MED ORDER — ENOXAPARIN SODIUM 40 MG/0.4ML IJ SOSY
40.0000 mg | PREFILLED_SYRINGE | INTRAMUSCULAR | Status: DC
  Administered 2021-07-04: 40 mg via SUBCUTANEOUS
  Filled 2021-07-03: qty 0.4

## 2021-07-03 MED ORDER — ENSURE ENLIVE PO LIQD
237.0000 mL | Freq: Two times a day (BID) | ORAL | Status: DC
  Administered 2021-07-04 – 2021-07-06 (×4): 237 mL via ORAL

## 2021-07-03 MED ORDER — ACETAMINOPHEN 650 MG RE SUPP
650.0000 mg | Freq: Four times a day (QID) | RECTAL | Status: DC | PRN
  Filled 2021-07-03: qty 1

## 2021-07-03 MED ORDER — SODIUM CHLORIDE 0.9 % IV BOLUS
500.0000 mL | Freq: Once | INTRAVENOUS | Status: AC
  Administered 2021-07-03: 500 mL via INTRAVENOUS

## 2021-07-03 MED ORDER — ONDANSETRON HCL 4 MG/2ML IJ SOLN: 4.0000 mg | Freq: Four times a day (QID) | INTRAMUSCULAR | Status: DC | PRN

## 2021-07-03 MED ORDER — ONDANSETRON HCL 4 MG PO TABS: 4.0000 mg | ORAL_TABLET | Freq: Four times a day (QID) | ORAL | Status: DC | PRN

## 2021-07-03 NOTE — ED Notes (Signed)
Critical Na level 117. MD made aware. Awaiting orders

## 2021-07-03 NOTE — ED Triage Notes (Signed)
Pt with generalized chest pain and shob that began tonight. Pt with history of reflux, anxiety, chf. Md at bedside, ems gave 324mg  asa.

## 2021-07-03 NOTE — ED Notes (Signed)
Pt given a warm blanket 

## 2021-07-03 NOTE — H&P (Signed)
History and Physical    Peggy Brown IRW:[id number] DOB: 28-Mar-1956 DOA: 07/03/2021  PCP: Patient, No Pcp Per (Inactive)   Patient coming from: home  I have personally briefly reviewed patient's old medical records in [facility]  Chief Complaint: shortness of breath, weakness  HPI: Peggy Brown is a 65 y.o. female with medical history significant for  ex-alcohol use disorder, GI bleed 04/2021 requiring blood transfusion, pSVT in 05/2021 complicated by CHF exacerbation and respiratory failure, diastolic CHF (EF 76% 04/2021), last seen by cardiology on 9/23, who presents to the emergency room with chest pain and shortness of breath  the night prior to arrival.  During her cardiology visit on 9/23 she was found to be euvolemic but complaining of fatigue.  She had a BMP that showed sodium of 126 and her furosemide was decreased on 9/26 from daily to as needed only.  At the onset of the shortness of breath the night prior, she took a dose of Lasix but by the day of arrival she was feeling no better.  She denies orthopnea PND or lower extremity edema but daughter says she has had shooting pains in both her legs for the past 3 days.  Denies abdominal pain or change in bowel habits.   Patient received aspirin 324 with EMS. Daughter [name] contributes to history over the phone  ED course: On arrival afebrile, BP 144/96 with pulse 77 respirations 32 with O2 sat 100% on room air Blood work: Sodium 117, down from 126 on 9/23.  Troponin 7, BNP pending WBC 11.3, hemoglobin 11.1 which is around her baseline COVID and flu negative  EKG, personally viewed and interpreted: NSR at 82 with nonspecific ST-T wave changes  Chest x-ray: Cardiomegaly with no active cardiopulmonary disease  Patient given a 500 mL NS bolus.  Hospitalist consulted for admission for hyponatremia.  Review of Systems: As per HPI otherwise all other systems on review of systems negative.    Past Medical History:  Diagnosis  Date   Anemia    CHF (congestive heart failure) (HCC)    SVT (supraventricular tachycardia) (HCC)     Past Surgical History:  Procedure Laterality Date   CESAREAN SECTION     COLONOSCOPY N/A 04/27/2021   Procedure: COLONOSCOPY;  Surgeon: Regis Bill, MD;  Location: [facility] ENDOSCOPY;  Service: Endoscopy;  Laterality: N/A;   ESOPHAGOGASTRODUODENOSCOPY N/A 04/27/2021   Procedure: ESOPHAGOGASTRODUODENOSCOPY (EGD);  Surgeon: Regis Bill, MD;  Location: [facility] ENDOSCOPY;  Service: Endoscopy;  Laterality: N/A;     reports that she has been smoking cigarettes. She has a 22.50 pack-year smoking history. She has never used smokeless tobacco. She reports current alcohol use of about 2.0 standard drinks per week. She reports that she does not use drugs.  No Known Allergies  Family History  Problem Relation Age of Onset   Hypertension Mother    Thyroid cancer Mother    COPD Father       Prior to Admission medications   Medication Sig Start Date End Date Taking? Authorizing Provider  acetaminophen (TYLENOL) 500 MG tablet Take 1,000 mg by mouth every 8 (eight) hours as needed for moderate pain or mild pain.    [provider]  feeding supplement (ENSURE ENLIVE / ENSURE PLUS) LIQD Take 237 mLs by mouth 2 (two) times daily between meals. 04/30/21   Marrion Coy, MD  furosemide (LASIX) 20 MG tablet Take 1 tablet (20 mg total) by mouth daily. 05/14/21   Lorin Glass, MD  metoprolol succinate (  TOPROL-XL) 25 MG 24 hr tablet Take 1 tablet (25 mg total) by mouth daily. 05/14/21   Lorin Glass, MD  Multiple Vitamin (MULTIVITAMIN WITH MINERALS) TABS tablet Take 1 tablet by mouth daily. 05/13/21   Dahal, Melina Schools, MD  nicotine (NICODERM CQ - DOSED IN MG/24 HOURS) 14 mg/24hr patch Place 1 patch (14 mg total) onto the skin daily. Patient not taking: Reported on 06/25/2021 05/01/21   Marrion Coy, MD  pantoprazole (PROTONIX) 40 MG tablet Take 1 tablet (40 mg total) by mouth 2 (two) times daily.  04/30/21   Marrion Coy, MD  potassium chloride SA (KLOR-CON) 20 MEQ tablet Take 40 mEq by mouth daily. Take 2 tablets once daily    [provider]    Physical Exam: Vitals:   07/03/21 2136 07/03/21 2200 07/03/21 2230 07/03/21 2300  BP: (!) 144/96 108/78 (!) 166/86 (!) 163/74  Pulse:  74 77 83  Resp:  (!) 32 17 13  Temp: 98 F (36.7 C)     TempSrc: Oral     SpO2:  100% 97% 99%  Weight:      Height:         Vitals:   07/03/21 2136 07/03/21 2200 07/03/21 2230 07/03/21 2300  BP: (!) 144/96 108/78 (!) 166/86 (!) 163/74  Pulse:  74 77 83  Resp:  (!) 32 17 13  Temp: 98 F (36.7 C)     TempSrc: Oral     SpO2:  100% 97% 99%  Weight:      Height:          Constitutional: cachectic appearance,Alert and oriented x 3 .  Speaking in short sentences, appears breathless  HEENT:      Head: Normocephalic and atraumatic.         Eyes: PERLA, EOMI, Conjunctivae are normal. Sclera is non-icteric.       Mouth/Throat: Mucous membranes are moist.       Neck: Supple with no signs of meningismus. Cardiovascular: Regular rate and rhythm. No murmurs, gallops, or rubs. 2+ symmetrical distal pulses are present . No JVD. No LE edema Respiratory: Respiratory effort increased.Lungs sounds clear bilaterally. No wheezes, crackles, or rhonchi.  Gastrointestinal: Soft, non tender, and non distended with positive bowel sounds.  Genitourinary: No CVA tenderness. Musculoskeletal: Pain bilateral knees. No cyanosis, or erythema of extremities. Neurologic:  Face is symmetric. Moving all extremities. No gross focal neurologic deficits . Skin: Skin is warm, dry.  No rash or ulcers Psychiatric: Mood and affect are normal    Labs on Admission: I have personally reviewed following labs and imaging studies  CBC: Recent Labs  Lab 07/03/21 2139  WBC 11.3*  NEUTROABS 6.2  HGB 11.1*  HCT 29.9*  MCV 85.9  PLT 294   Basic Metabolic Panel: Recent Labs  Lab 07/03/21 2139  NA 117*  K 3.7  CL 82*   CO2 23  GLUCOSE 97  BUN 8  CREATININE 0.41*  CALCIUM 9.0   GFR: Estimated Creatinine Clearance: 41 mL/min (A) (by C-G formula based on SCr of 0.41 mg/dL (L)). Liver Function Tests: Recent Labs  Lab 07/03/21 2139  AST 27  ALT 14  ALKPHOS 125  BILITOT 0.7  PROT 6.6  ALBUMIN 3.2*   No results for input(s): LIPASE, AMYLASE in the last 168 hours. No results for input(s): AMMONIA in the last 168 hours. Coagulation Profile: No results for input(s): INR, PROTIME in the last 168 hours. Cardiac Enzymes: No results for input(s): CKTOTAL, CKMB, CKMBINDEX, TROPONINI in the  last 168 hours. BNP (last 3 results) No results for input(s): PROBNP in the last 8760 hours. HbA1C: No results for input(s): HGBA1C in the last 72 hours. CBG: No results for input(s): GLUCAP in the last 168 hours. Lipid Profile: No results for input(s): CHOL, HDL, LDLCALC, TRIG, CHOLHDL, LDLDIRECT in the last 72 hours. Thyroid Function Tests: No results for input(s): TSH, T4TOTAL, FREET4, T3FREE, THYROIDAB in the last 72 hours. Anemia Panel: No results for input(s): VITAMINB12, FOLATE, FERRITIN, TIBC, IRON, RETICCTPCT in the last 72 hours. Urine analysis:    Component Value Date/Time   COLORURINE AMBER (A) 10/21/2019 1447   APPEARANCEUR CLOUDY (A) 10/21/2019 1447   APPEARANCEUR Clear 06/17/2014 1115   LABSPEC 1.021 10/21/2019 1447   LABSPEC 1.006 06/17/2014 1115   PHURINE 5.0 10/21/2019 1447   GLUCOSEU 50 (A) 10/21/2019 1447   GLUCOSEU Negative 06/17/2014 1115   HGBUR NEGATIVE 10/21/2019 1447   BILIRUBINUR NEGATIVE 10/21/2019 1447   BILIRUBINUR Negative 06/17/2014 1115   KETONESUR 5 (A) 10/21/2019 1447   PROTEINUR 30 (A) 10/21/2019 1447   NITRITE NEGATIVE 10/21/2019 1447   LEUKOCYTESUR TRACE (A) 10/21/2019 1447   LEUKOCYTESUR Negative 06/17/2014 1115    Radiological Exams on Admission: DG Chest Portable 1 View  Result Date: 07/03/2021 CLINICAL DATA:  Acute onset of chest pain and shortness of  breath several hours ago. EXAM: PORTABLE CHEST 1 VIEW COMPARISON:  05/11/2021 FINDINGS: Stable mild cardiomegaly. Aortic atherosclerotic calcification noted. Both lungs are well aerated and clear. IMPRESSION: Stable mild cardiomegaly. No active lung disease. Electronically Signed   By: Danae Orleans M.D.   On: 07/03/2021 22:03     Assessment/Plan 65 year old female with history of  GI bleed 04/2021 in the setting of ex alcohol use, pSVT in 05/2021 complicated by CHF exacerbation and respiratory failure, diastolic CHF (EF 46% 04/2021), last seen by cardiology on 9/23, presenting with chest pain, generalized weakness and dyspnea on exertion.   Acute dyspnea - Suspect related to mild CHF exacerbation given recent discontinuation of lasix, possible symptomatic hyponatremia, - CTA chest negative for PE    Hyponatremia, symptomatic, acute to subacute - Sodium 117, down from 126 on 9/23 - Suspect multifactorial, related to Lasix, dehydration,. - Follow-up urine and sodium osmolality and urine sodium - Patient received 500 mL NS in the ED - Continue to monitor sodium and correct as appropriate    Chronic diastolic CHF with possible mild exacerbation - Patient symptomatic for shortness of breath with exertion though chest x-ray clear, and BNP normal at 12 - Patient stopped Lasix on cardiology's advice on 9/26 due to hyponatremia - Current weight 38.1 kg, slightly increased from 37.3 on 9/23 - For now continue home Lasix and metoprolol - Daily weights with intake and output monitoring - Most recent echo from 7/22 with EF 50 to 55%  Leg pain/knee pain --Patient complains of legs feeling cold and pain in the knees --pain control    History of GI bleed( melena)   Anemia of chronic disease - History of melena 04/2021 with hemoglobin 5.4 requiring PRBCs with polyps on colonoscopy, gastritis on EGD - Hemoglobin currently 11.1 which is at baseline -Continue Protonix  History of alcohol use disorder,  moderate, dependence  - Patient stopped drinking in July 2022, confirmed by daughter    Underweight/protein calorie malnutrition unspecified Generalized weakness - Continue Ensure 3 times daily - Considered dietary and physical therapist consult    DVT prophylaxis: Lovenox  Code Status: full code  Family Communication: Daughter over the phone  Disposition Plan: Back to previous home environment Consults called: none  Status:At the time of admission, it appears that the appropriate admission status for this patient is INPATIENT. This is judged to be reasonable and necessary in order to provide the required intensity of service to ensure the patient's safety given the presenting symptoms, physical exam findings, and initial radiographic and laboratory data in the context of their  Comorbid conditions.   Patient requires inpatient status due to high intensity of service, high risk for further deterioration and high frequency of surveillance required.   I certify that at the point of admission it is my clinical judgment that the patient will require inpatient hospital care spanning beyond 2 midnights     Andris Baumann MD [facility]     07/03/2021, 11:14 PM

## 2021-07-03 NOTE — ED Notes (Signed)
Spoke w/ [name], pt's daughter @[phone], updated on pt's status

## 2021-07-03 NOTE — ED Provider Notes (Signed)
[facility] Emergency Department Provider Note  ____________________________________________  Time seen: Approximately 11:05 PM  I have reviewed the triage vital signs and the nursing notes.   HISTORY  Chief Complaint Chest Pain  Level 5 Caveat: Portions of the History and Physical including HPI and review of systems are unable to be completely obtained due to patient being a poor historian    HPI Peggy Brown is a 65 y.o. female with a history of anemia, alcohol use disorder, grade 1 diastolic CHF who comes ED complaining of chest discomfort which is not exertional not pleuritic no shortness of breath no vomiting diaphoresis.  Its intermittent, no aggravating or alleviating factors.  No falls or trauma.  She does report poor oral intake for the last several days.  No vomiting diarrhea or abdominal pain.  Patient also complains of feeling very cold.  Past Medical History:  Diagnosis Date   Anemia    CHF (congestive heart failure) (HCC)    SVT (supraventricular tachycardia) (HCC)      Patient Active Problem List   Diagnosis Date Noted   Alcohol use disorder, moderate, dependence (HCC) 07/03/2021   Generalized weakness 07/03/2021   Chronic diastolic CHF (congestive heart failure) (HCC) 07/03/2021   Underweight 07/03/2021   Acute diastolic CHF (congestive heart failure) (HCC) 05/11/2021   Anemia of chronic disease    Thrombocytopenia (HCC)    Acute on chronic blood loss anemia 04/26/2021   Hypotension    Alcohol abuse    Hypokalemia    Hypomagnesemia    SVT (supraventricular tachycardia) (HCC) 04/25/2021   Severe protein-calorie malnutrition (HCC) 04/21/2018   Hyponatremia 04/19/2018     Past Surgical History:  Procedure Laterality Date   CESAREAN SECTION     COLONOSCOPY N/A 04/27/2021   Procedure: COLONOSCOPY;  Surgeon: Regis Bill, MD;  Location: [facility] ENDOSCOPY;  Service: Endoscopy;  Laterality: N/A;    ESOPHAGOGASTRODUODENOSCOPY N/A 04/27/2021   Procedure: ESOPHAGOGASTRODUODENOSCOPY (EGD);  Surgeon: Regis Bill, MD;  Location: [facility] ENDOSCOPY;  Service: Endoscopy;  Laterality: N/A;     Prior to Admission medications   Medication Sig Start Date End Date Taking? Authorizing Provider  acetaminophen (TYLENOL) 500 MG tablet Take 1,000 mg by mouth every 8 (eight) hours as needed for moderate pain or mild pain.    [provider]  feeding supplement (ENSURE ENLIVE / ENSURE PLUS) LIQD Take 237 mLs by mouth 2 (two) times daily between meals. 04/30/21   Marrion Coy, MD  furosemide (LASIX) 20 MG tablet Take 1 tablet (20 mg total) by mouth daily. 05/14/21   Lorin Glass, MD  metoprolol succinate (TOPROL-XL) 25 MG 24 hr tablet Take 1 tablet (25 mg total) by mouth daily. 05/14/21   Lorin Glass, MD  Multiple Vitamin (MULTIVITAMIN WITH MINERALS) TABS tablet Take 1 tablet by mouth daily. 05/13/21   Dahal, Melina Schools, MD  nicotine (NICODERM CQ - DOSED IN MG/24 HOURS) 14 mg/24hr patch Place 1 patch (14 mg total) onto the skin daily. Patient not taking: Reported on 06/25/2021 05/01/21   Marrion Coy, MD  pantoprazole (PROTONIX) 40 MG tablet Take 1 tablet (40 mg total) by mouth 2 (two) times daily. 04/30/21   Marrion Coy, MD  potassium chloride SA (KLOR-CON) 20 MEQ tablet Take 40 mEq by mouth daily. Take 2 tablets once daily    [provider]     Allergies Patient has no known allergies.   Family History  Problem Relation Age of Onset   Hypertension Mother    Thyroid  cancer Mother    COPD Father     Social History Social History   Tobacco Use   Smoking status: Every Day    Packs/day: 0.50    Years: 45.00    Pack years: 22.50    Types: Cigarettes   Smokeless tobacco: Never  Substance Use Topics   Alcohol use: Yes    Alcohol/week: 2.0 standard drinks    Types: 2 Cans of beer per week   Drug use: Never    Review of Systems  Constitutional:   No fever positive chills.   ENT:   No sore throat. No rhinorrhea. Cardiovascular: Positive atypical chest pain as above without syncope. Respiratory:   No dyspnea or cough. Gastrointestinal:   Negative for abdominal pain, vomiting and diarrhea.  Musculoskeletal:   Negative for focal pain or swelling All other systems reviewed and are negative except as documented above in ROS and HPI.  ____________________________________________   PHYSICAL EXAM:  VITAL SIGNS: ED Triage Vitals  Enc Vitals Group     BP 07/03/21 2136 (!) 144/96     Pulse Rate 07/03/21 2133 80     Resp 07/03/21 2133 (!) 22     Temp 07/03/21 2136 98 F (36.7 C)     Temp Source 07/03/21 2133 Oral     SpO2 07/03/21 2133 98 %     Weight 07/03/21 2132 84 lb (38.1 kg)     Height 07/03/21 2132 5\' 1"  (1.549 m)     Head Circumference --      Peak Flow --      Pain Score 07/03/21 2134 4     Pain Loc --      Pain Edu? --      Excl. in GC? --     Vital signs reviewed, nursing assessments reviewed.   Constitutional:   Alert and oriented. Non-toxic appearance. Eyes:   Conjunctivae are normal. EOMI. PERRL. ENT      Head:   Normocephalic and atraumatic.      Nose:   Normal.      Mouth/Throat:   Dry mucous membranes      Neck:   No meningismus. Full ROM. Hematological/Lymphatic/Immunilogical:   No cervical lymphadenopathy. Cardiovascular:   RRR. Symmetric bilateral radial and DP pulses.  No murmurs. Cap refill less than 2 seconds. Respiratory:   Normal respiratory effort without tachypnea/retractions. Breath sounds are clear and equal bilaterally. No wheezes/rales/rhonchi. Gastrointestinal:   Soft and nontender. Non distended. There is no CVA tenderness.  No rebound, rigidity, or guarding. Genitourinary:   deferred Musculoskeletal:   Normal range of motion in all extremities. No joint effusions.  No lower extremity tenderness.  No edema.  Chest wall tender Neurologic:   Normal speech and language.  Motor grossly intact. No acute focal  neurologic deficits are appreciated.  Skin:    Skin is warm, dry and intact. No rash noted.  No petechiae, purpura, or bullae.  ____________________________________________    LABS (pertinent positives/negatives) (all labs ordered are listed, but only abnormal results are displayed) Labs Reviewed  COMPREHENSIVE METABOLIC PANEL - Abnormal; Notable for the following components:      Result Value   Sodium 117 (*)    Chloride 82 (*)    Creatinine, Ser 0.41 (*)    Albumin 3.2 (*)    All other components within normal limits  CBC WITH DIFFERENTIAL/PLATELET - Abnormal; Notable for the following components:   WBC 11.3 (*)    RBC 3.48 (*)    Hemoglobin 11.1 (*)  HCT 29.9 (*)    MCHC 37.1 (*)    Monocytes Absolute 1.1 (*)    All other components within normal limits  RESP PANEL BY RT-PCR (FLU A&B, COVID) ARPGX2  URINALYSIS, COMPLETE (UACMP) WITH MICROSCOPIC  BRAIN NATRIURETIC PEPTIDE  TROPONIN I (HIGH SENSITIVITY)  TROPONIN I (HIGH SENSITIVITY)   ____________________________________________   EKG  Interpreted by me Sinus rhythm rate of 82, normal axis and intervals.  Normal QRS ST segments and T waves.  No ischemic changes.  ____________________________________________    RADIOLOGY  DG Chest Portable 1 View  Result Date: 07/03/2021 CLINICAL DATA:  Acute onset of chest pain and shortness of breath several hours ago. EXAM: PORTABLE CHEST 1 VIEW COMPARISON:  05/11/2021 FINDINGS: Stable mild cardiomegaly. Aortic atherosclerotic calcification noted. Both lungs are well aerated and clear. IMPRESSION: Stable mild cardiomegaly. No active lung disease. Electronically Signed   By: Danae Orleans M.D.   On: 07/03/2021 22:03    ____________________________________________   PROCEDURES .Critical Care Performed by: Sharman Cheek, MD Authorized by: Sharman Cheek, MD   Critical care provider statement:    Critical care time (minutes):  33   Critical care time was exclusive of:   Separately billable procedures and treating other patients   Critical care was necessary to treat or prevent imminent or life-threatening deterioration of the following conditions:  Metabolic crisis and dehydration   Critical care was time spent personally by me on the following activities:  Development of treatment plan with patient or surrogate, discussions with consultants, evaluation of patient's response to treatment, examination of patient, obtaining history from patient or surrogate, ordering and performing treatments and interventions, ordering and review of laboratory studies, ordering and review of radiographic studies, pulse oximetry, re-evaluation of patient's condition and review of old charts  ____________________________________________  DIFFERENTIAL DIAGNOSIS   Dehydration, electrolyte abnormality, viral illness, non-STEMI, pneumonia, pleural effusion, pulmonary edema  CLINICAL IMPRESSION / ASSESSMENT AND PLAN / ED COURSE  Medications ordered in the ED: Medications  sodium chloride 0.9 % bolus 500 mL (0 mLs Intravenous Stopped 07/03/21 2307)    Pertinent labs & imaging results that were available during my care of the patient were reviewed by me and considered in my medical decision making (see chart for details).  Hephzibah Strehle was evaluated in Emergency Department on 07/03/2021 for the symptoms described in the history of present illness. She was evaluated in the context of the global COVID-19 pandemic, which necessitated consideration that the patient might be at risk for infection with the SARS-CoV-2 virus that causes COVID-19. Institutional protocols and algorithms that pertain to the evaluation of patients at risk for COVID-19 are in a state of rapid change based on information released by regulatory bodies including the CDC and federal and state organizations. These policies and algorithms were followed during the patient's care in the ED.   Patient presents with atypical  chest pain, chills, malaise.  Doubt ACS PE dissection or AAA.  Chest x-ray EKG and labs are all unremarkable except for a sodium level of 117.  With her alcohol use history, this may be related to beer potomania or nutritional deficiency.  IV saline given for hydration.  Patient will need to be admitted for further management.      ____________________________________________   FINAL CLINICAL IMPRESSION(S) / ED DIAGNOSES    Final diagnoses:  Hyponatremia  Weakness  Atypical chest pain     ED Discharge Orders     None       Portions of this  note were generated with dragon dictation software. Dictation errors may occur despite best attempts at proofreading.    Sharman Cheek, MD 07/03/21 2310

## 2021-07-04 ENCOUNTER — Inpatient Hospital Stay: Payer: Medicare Other

## 2021-07-04 DIAGNOSIS — R0602 Shortness of breath: Secondary | ICD-10-CM

## 2021-07-04 DIAGNOSIS — F1020 Alcohol dependence, uncomplicated: Secondary | ICD-10-CM

## 2021-07-04 DIAGNOSIS — R079 Chest pain, unspecified: Secondary | ICD-10-CM | POA: Diagnosis present

## 2021-07-04 DIAGNOSIS — I5032 Chronic diastolic (congestive) heart failure: Secondary | ICD-10-CM

## 2021-07-04 DIAGNOSIS — D638 Anemia in other chronic diseases classified elsewhere: Secondary | ICD-10-CM

## 2021-07-04 LAB — BASIC METABOLIC PANEL
Anion gap: 10 (ref 5–15)
Anion gap: 6 (ref 5–15)
Anion gap: 6 (ref 5–15)
BUN: 6 mg/dL — ABNORMAL LOW (ref 8–23)
BUN: 6 mg/dL — ABNORMAL LOW (ref 8–23)
BUN: 17 mg/dL (ref 8–23)
CO2: 22 mmol/L (ref 22–32)
CO2: 26 mmol/L (ref 22–32)
CO2: 26 mmol/L (ref 22–32)
Calcium: 8.7 mg/dL — ABNORMAL LOW (ref 8.9–10.3)
Calcium: 8.9 mg/dL (ref 8.9–10.3)
Calcium: 8.7 mg/dL — ABNORMAL LOW (ref 8.9–10.3)
Chloride: 87 mmol/L — ABNORMAL LOW (ref 98–111)
Chloride: 86 mmol/L — ABNORMAL LOW (ref 98–111)
Chloride: 88 mmol/L — ABNORMAL LOW (ref 98–111)
Creatinine, Ser: 0.48 mg/dL (ref 0.44–1.00)
Creatinine, Ser: 0.43 mg/dL — ABNORMAL LOW (ref 0.44–1.00)
Creatinine, Ser: 0.55 mg/dL (ref 0.44–1.00)
GFR, Estimated: >60 mL/min (ref >60)
GFR, Estimated: >60 mL/min (ref >60)
GFR, Estimated: >60 mL/min (ref >60)
Glucose, Bld: 100 mg/dL — ABNORMAL HIGH (ref 70–99)
Glucose, Bld: 91 mg/dL (ref 70–99)
Glucose, Bld: 108 mg/dL — ABNORMAL HIGH (ref 70–99)
Potassium: 3.6 mmol/L (ref 3.5–5.1)
Potassium: 3.5 mmol/L (ref 3.5–5.1)
Potassium: 4.5 mmol/L (ref 3.5–5.1)
Sodium: 119 mmol/L — CL (ref 135–145)
Sodium: 118 mmol/L — CL (ref 135–145)
Sodium: 120 mmol/L — ABNORMAL LOW (ref 135–145)

## 2021-07-04 LAB — SODIUM: Sodium: 120 mmol/L — ABNORMAL LOW (ref 135–145)

## 2021-07-04 LAB — URINALYSIS, COMPLETE (UACMP) WITH MICROSCOPIC
Bacteria, UA: NONE SEEN
Bilirubin Urine: NEGATIVE
Glucose, UA: NEGATIVE mg/dL
Hgb urine dipstick: NEGATIVE
Ketones, ur: NEGATIVE mg/dL
Leukocytes,Ua: NEGATIVE
Nitrite: NEGATIVE
Protein, ur: NEGATIVE mg/dL
Specific Gravity, Urine: 1.009 (ref 1.005–1.030)
pH: 7 (ref 5.0–8.0)

## 2021-07-04 LAB — TROPONIN I (HIGH SENSITIVITY): Troponin I (High Sensitivity): 8 ng/L (ref <18)

## 2021-07-04 LAB — OSMOLALITY, URINE: Osmolality, Ur: 169 mOsm/kg — ABNORMAL LOW (ref 300–900)

## 2021-07-04 LAB — SODIUM, URINE, RANDOM: Sodium, Ur: <10 mmol/L

## 2021-07-04 LAB — D-DIMER, QUANTITATIVE: D-Dimer, Quant: 0.94 ug/mL-FEU — ABNORMAL HIGH (ref 0.00–0.50)

## 2021-07-04 IMAGING — CT CT ANGIO CHEST
2 of 6 series · 19 of 46 positions shown · IV contrast (APPLIED)
Comparison: [DATE]

CLINICAL DATA: Intermediate to low probability for pulmonary
embolism

EXAM:
CT ANGIOGRAPHY CHEST WITH CONTRAST
TECHNIQUE: Multidetector CT imaging of the chest was performed using the
standard protocol during bolus administration of intravenous
contrast. Multiplanar CT image reconstructions and MIPs were
obtained to evaluate the vascular anatomy.
CONTRAST:  75mL OMNIPAQUE IOHEXOL 350 MG/ML SOLN

[Series 5: thins · axial · 0.71mm/px · z∈[-1089,-772]mm · 16 of 435 slices shown]
[im 19/435  lung]
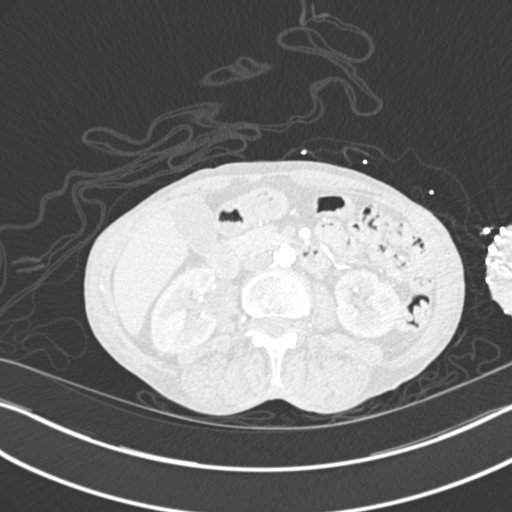
[im 55/435  soft-tissue]
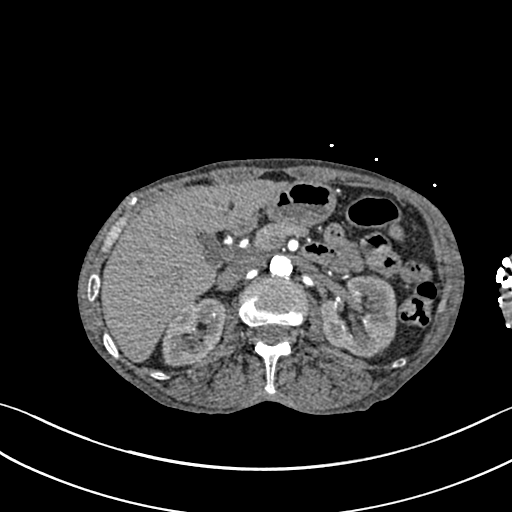
[im 73/435  lung]
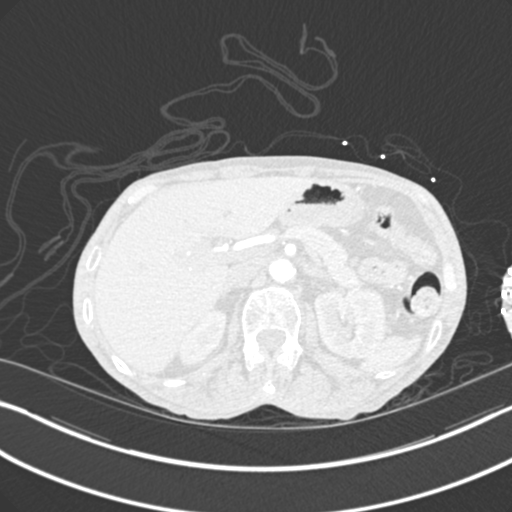
[im 109/435  soft-tissue]
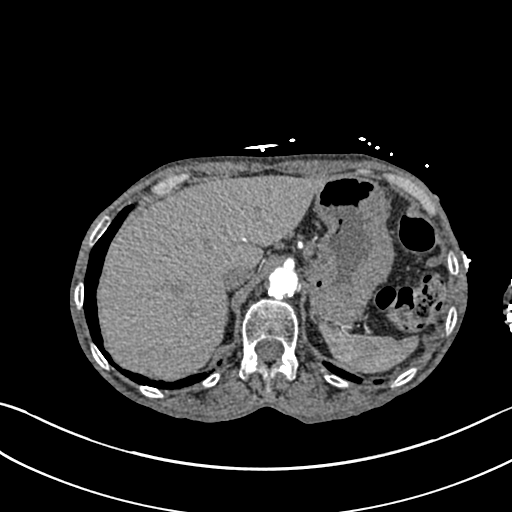
[im 127/435  lung]
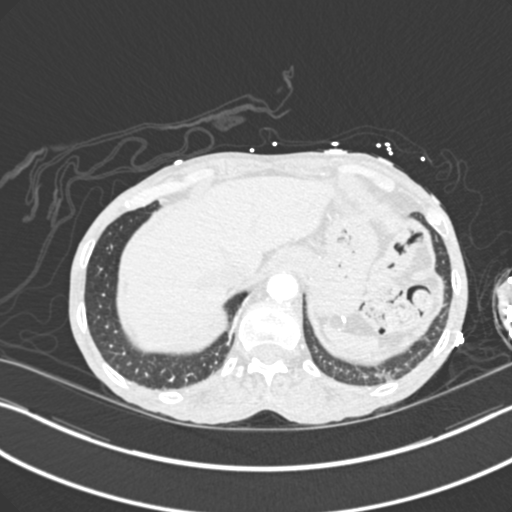
[im 145/435  soft-tissue]
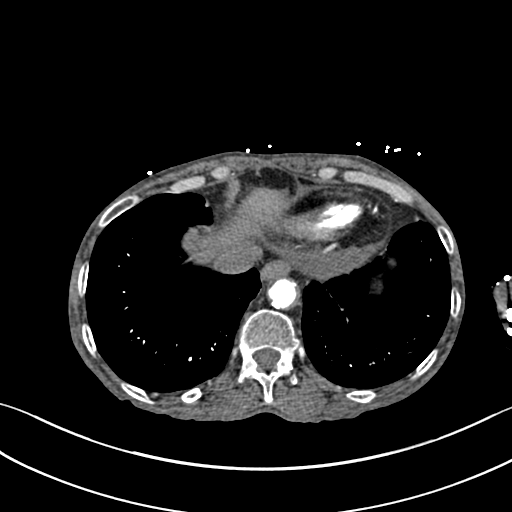
[im 181/435  lung]
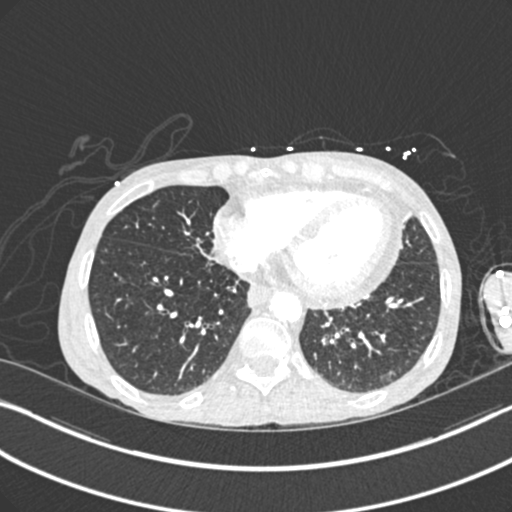
[im 199/435  soft-tissue]
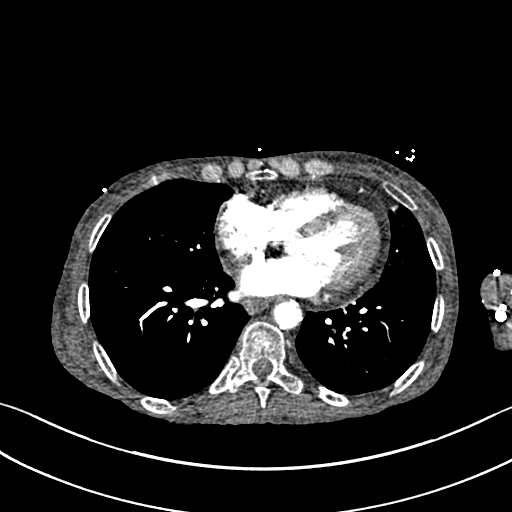
[im 236/435  lung]
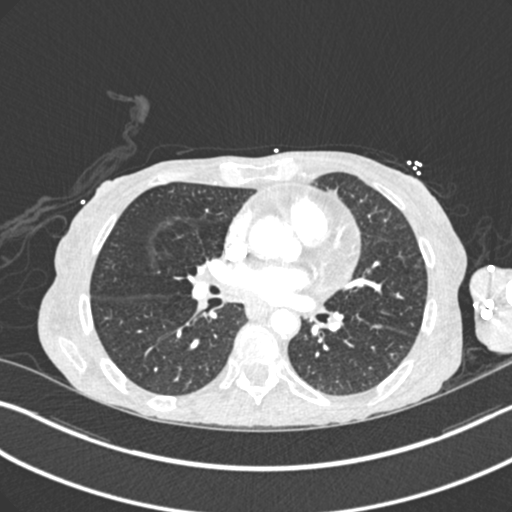
[im 254/435  soft-tissue]
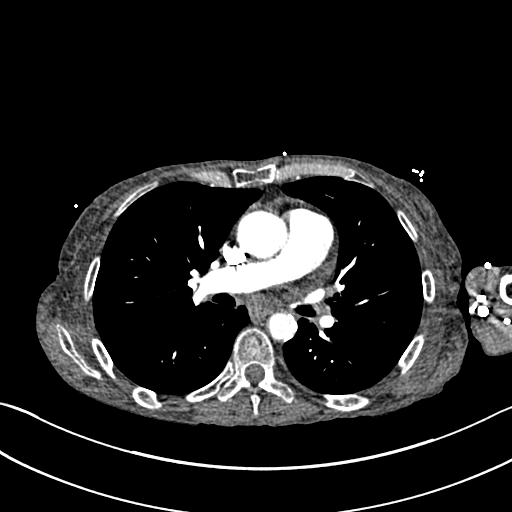
[im 290/435  lung]
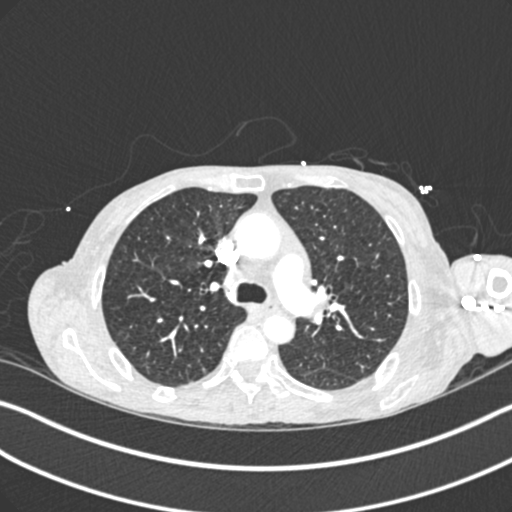
[im 308/435  soft-tissue]
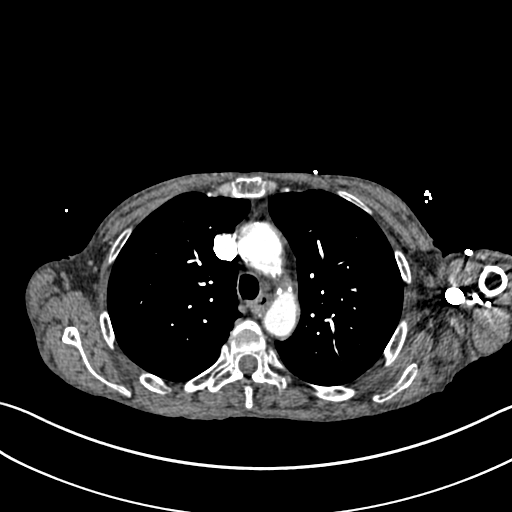
[im 326/435  lung]
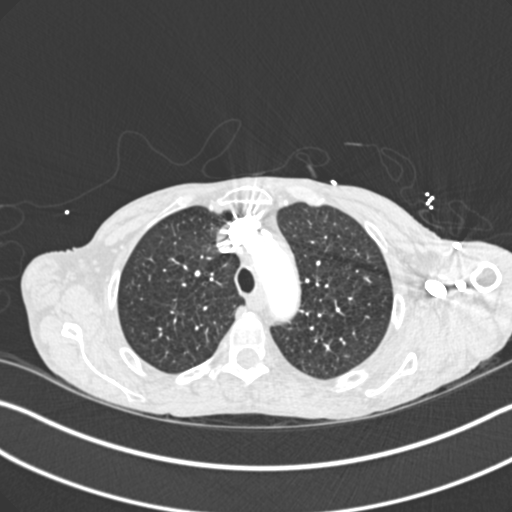
[im 362/435  soft-tissue]
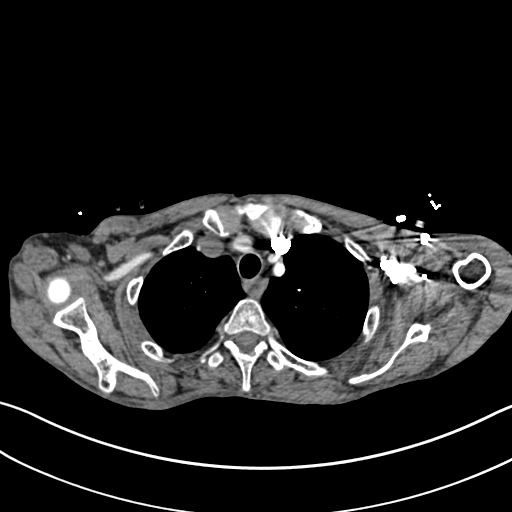
[im 380/435  lung]
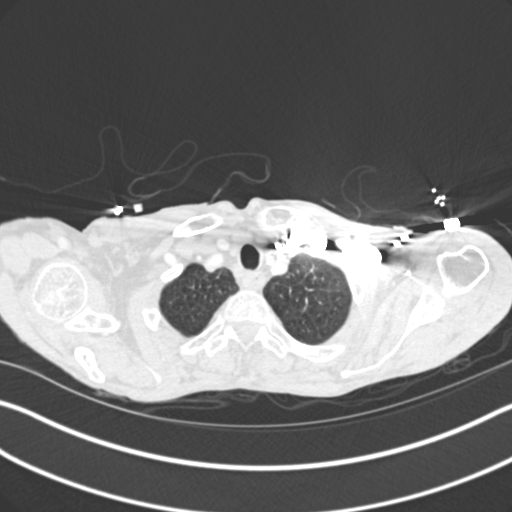
[im 416/435  soft-tissue]
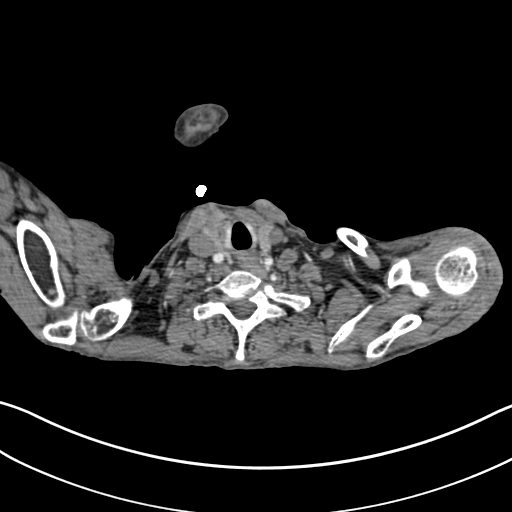

[Series 7: coronal mpr · coronal · 0.68mm/px · 3 of 104 slices shown]
[im 26/104  soft-tissue]
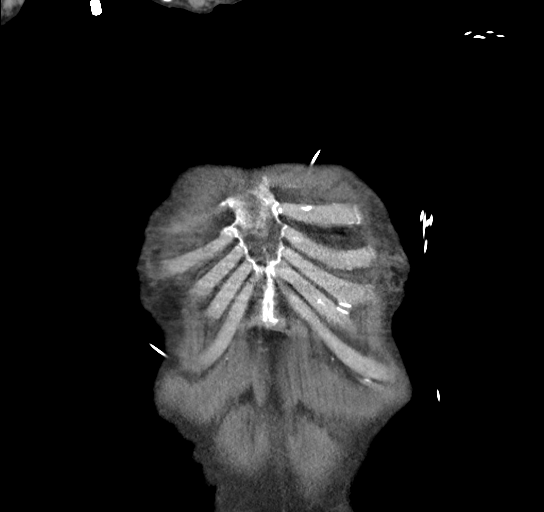
[im 52/104  soft-tissue]
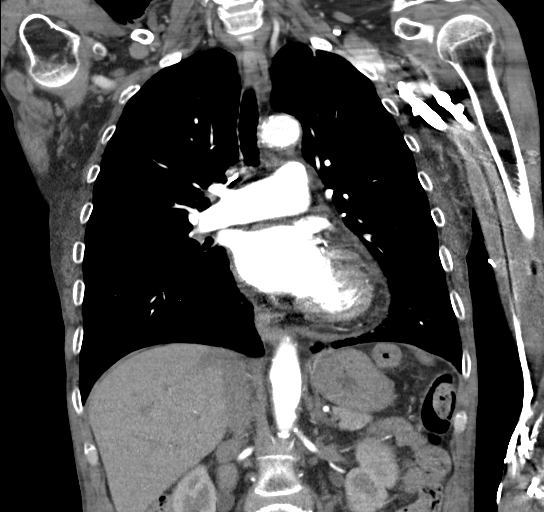
[im 78/104  soft-tissue]
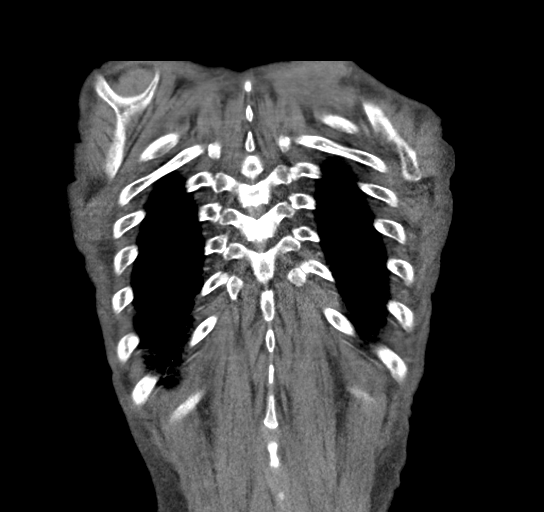

[19 of 46 positions shown; findings below may reference images not displayed]

FINDINGS: Cardiovascular: Satisfactory opacification of the pulmonary arteries
to the segmental level. No evidence of pulmonary embolism. Normal
heart size. No pericardial effusion. Aortic atheromatous
calcification.

Mediastinum/Nodes: Negative for mass or adenopathy.

Lungs/Pleura: There is no edema, consolidation, effusion, or
pneumothorax. Centrilobular emphysema at the apices.

Upper Abdomen: Cholelithiasis

Musculoskeletal: Negative

Review of the MIP images confirms the above findings.
IMPRESSION: 1. Negative for pulmonary embolism or other acute finding.
2. Aortic Atherosclerosis ([16]-[16]) and Emphysema ([16]-[16]).

## 2021-07-04 MED ORDER — LIDOCAINE VISCOUS HCL 2 % MT SOLN
15.0000 mL | Freq: Once | OROMUCOSAL | Status: AC
  Administered 2021-07-04: 15 mL via ORAL
  Filled 2021-07-04: qty 15

## 2021-07-04 MED ORDER — SODIUM CHLORIDE 3 % IV SOLN
INTRAVENOUS | Status: DC
  Filled 2021-07-04 (×2): qty 500

## 2021-07-04 MED ORDER — HYDROCODONE-ACETAMINOPHEN 5-325 MG PO TABS: 1.0000 | ORAL_TABLET | ORAL | Status: DC | PRN

## 2021-07-04 MED ORDER — ALUM & MAG HYDROXIDE-SIMETH 200-200-20 MG/5ML PO SUSP
30.0000 mL | Freq: Once | ORAL | Status: AC
  Administered 2021-07-04: 30 mL via ORAL
  Filled 2021-07-04: qty 30

## 2021-07-04 MED ORDER — IOHEXOL 350 MG/ML SOLN
75.0000 mL | Freq: Once | INTRAVENOUS | Status: AC | PRN
  Administered 2021-07-04: 75 mL via INTRAVENOUS

## 2021-07-04 MED ORDER — ALUM & MAG HYDROXIDE-SIMETH 200-200-20 MG/5ML PO SUSP: 15.0000 mL | Freq: Once | ORAL | Status: DC

## 2021-07-04 MED ORDER — ENOXAPARIN SODIUM 30 MG/0.3ML IJ SOSY
30.0000 mg | PREFILLED_SYRINGE | INTRAMUSCULAR | Status: DC
  Administered 2021-07-05 (×2): 30 mg via SUBCUTANEOUS
  Filled 2021-07-04 (×2): qty 0.3

## 2021-07-04 MED ORDER — NITROGLYCERIN 0.4 MG SL SUBL
0.4000 mg | SUBLINGUAL_TABLET | SUBLINGUAL | Status: DC | PRN
  Administered 2021-07-04: 0.4 mg via SUBLINGUAL
  Filled 2021-07-04: qty 1

## 2021-07-04 NOTE — Consult Note (Signed)
MEDICATION RELATED CONSULT NOTE - INITIAL   Pharmacy Consult for Hypertonic saline monitoring  Indication: Hyponatremia  No Known Allergies  Patient Measurements: Height: 5\' 1"  (154.9 cm) Weight: 38.3 kg (84 lb 7 oz) IBW/kg (Calculated) : 47.8   Vital Signs: Temp: 98.1 F (36.7 C) (10/02 2011) Temp Source: Oral (10/02 2011) BP: 152/82 (10/02 2011) Pulse Rate: 84 (10/02 2011) Intake/Output from previous day: 10/01 0701 - 10/02 0700 In: 500 [IV Piggyback:500] Out: -  Intake/Output from this shift: Total I/O In: -  Out: 100 [Urine:100]  Labs: Recent Labs    07/03/21 2139 07/04/21 0141 07/04/21 0513 07/04/21 1744  WBC 11.3*  --   --   --   HGB 11.1*  --   --   --   HCT 29.9*  --   --   --   PLT 294  --   --   --   CREATININE 0.41* 0.48 0.43* 0.55  ALBUMIN 3.2*  --   --   --   PROT 6.6  --   --   --   AST 27  --   --   --   ALT 14  --   --   --   ALKPHOS 125  --   --   --   BILITOT 0.7  --   --   --    Estimated Creatinine Clearance: 41.3 mL/min (by C-G formula based on SCr of 0.55 mg/dL).   Microbiology: Recent Results (from the past 720 hour(s))  Resp Panel by RT-PCR (Flu A&B, Covid) Nasopharyngeal Swab     Status: None   Collection Time: 07/03/21  9:37 PM   Specimen: Nasopharyngeal Swab; Nasopharyngeal(NP) swabs in vial transport medium  Result Value Ref Range Status   SARS Coronavirus 2 by RT PCR NEGATIVE NEGATIVE Final    Comment: (NOTE) SARS-CoV-2 target nucleic acids are NOT DETECTED.  The SARS-CoV-2 RNA is generally detectable in upper respiratory specimens during the acute phase of infection. The lowest concentration of SARS-CoV-2 viral copies this assay can detect is 138 copies/mL. A negative result does not preclude SARS-Cov-2 infection and should not be used as the sole basis for treatment or other patient management decisions. A negative result may occur with  improper specimen collection/handling, submission of specimen other than  nasopharyngeal swab, presence of viral mutation(s) within the areas targeted by this assay, and inadequate number of viral copies(<138 copies/mL). A negative result must be combined with clinical observations, patient history, and epidemiological information. The expected result is Negative.  Fact Sheet for Patients:  09/02/21  Fact Sheet for Healthcare Providers:  BloggerCourse.com  This test is no t yet approved or cleared by the [employer] FDA and  has been authorized for detection and/or diagnosis of SARS-CoV-2 by FDA under an Emergency Use Authorization (EUA). This EUA will remain  in effect (meaning this test can be used) for the duration of the COVID-19 declaration under Section 564(b)(1) of the Act, 21 U.S.C.section 360bbb-3(b)(1), unless the authorization is terminated  or revoked sooner.       Influenza A by PCR NEGATIVE NEGATIVE Final   Influenza B by PCR NEGATIVE NEGATIVE Final    Comment: (NOTE) The Xpert Xpress SARS-CoV-2/FLU/RSV plus assay is intended as an aid in the diagnosis of influenza from Nasopharyngeal swab specimens and should not be used as a sole basis for treatment. Nasal washings and aspirates are unacceptable for Xpert Xpress SARS-CoV-2/FLU/RSV testing.  Fact Sheet for Patients: [place]  Fact Sheet for  Healthcare Providers: [employer]it  This test is not yet approved or cleared by the [place] and has been authorized for detection and/or diagnosis of SARS-CoV-2 by FDA under an Emergency Use Authorization (EUA). This EUA will remain in effect (meaning this test can be used) for the duration of the COVID-19 declaration under Section 564(b)(1) of the Act, 21 U.S.C. section 360bbb-3(b)(1), unless the authorization is terminated or revoked.  Performed at [facility], [address] [place], [place]  [address]     Medical History: Past Medical History:  Diagnosis Date   Anemia    CHF (congestive heart failure) (HCC)    SVT (supraventricular tachycardia) (HCC)     Assessment: 65 y.o. F with history of alcohol use disorder, CHF, and GIB who prsented with 1 day chest pain, SOB and fatigue. Pt found to have a sodium of 119. At risk for ODS given alcohol use. Pharmacy has been consulted for concentrated sodium chloride monitoring.   Date Time Na 10/2 1744 120  Goal of Therapy:  Increase Na by 4-6 mEq/L in 4-6 hours Per MD, Goal sodium correction overnight is 124 by morning  Plan:  Start 3% sodium chloride at 25 mL/hr Monitor sodium every 2 hours x2 occurences, followed by every 4 hours Notify MD if Na increases by 4 mEq/L or more in the first 2 hours, or Na increases by 6 mEq/L or more in the first 4 hours Plan to stop hypertonic saline if sodium reaches 124 by morning    Derrek Gu, PharmD  07/04/2021,8:57 PM

## 2021-07-04 NOTE — Assessment & Plan Note (Addendum)
Recent echo with EF 50-55% chest x-ray clear, and BNP normal at 12 Acute CHF ruled out - Patient stopped Lasix on cardiology's advice on 9/26 due to hyponatremia - Hold furosemide - Continue metoprolol

## 2021-07-04 NOTE — Assessment & Plan Note (Signed)
Continue protonix  

## 2021-07-04 NOTE — Assessment & Plan Note (Addendum)
ECG unchanged and without ST changes.  Troponins undetectable.  CTA chest negative. Resolved with GI cocktail. - No further work up 

## 2021-07-04 NOTE — Assessment & Plan Note (Signed)
Stabel relative to abseline

## 2021-07-04 NOTE — Assessment & Plan Note (Signed)
No signs of withdrawal 

## 2021-07-04 NOTE — Progress Notes (Signed)
  Progress Note    Peggy Brown   NAT:[id number]  DOB: Feb 08, 1956  DOA: 07/03/2021     1 Date of Service: 07/04/2021    Brief summary: Peggy Brown is a 65 y.o. F with history AUD, dCHF, GIB 7/22, pSVT in 8/22 complicated by CHF and respiratory failure who prsented with 1 day chest pain, SOB and fatigue.  In the ER, ECG normal, unchanged from previous and troponins undetectable.  Na 117.  CXR and CTA chest unremarkable.    10/1 admitted for hyponatremia     Subjective:  Had some intermittent indigestion like chest pain today, resolved with viscous lidocaine.  No dyspnea. Very weak, tired.  No confusion, no sseizures.  No vomiting.  Hospital Problems * Generalized weakness Due to hyponatremia.  Her symptoms are not truly dyspnea, more exertional intolerance  Hyponatremia Patient mildly symptomatic.  Oriented but sleepy, weak.  No seizures, no confusion.  Na 117 > 119 > 118 overnight. Appears euvolemic, urine appears light in color, urine studies ordered never sent.  At risk for ODS given alcohol use, underweight.  -Hold fluids -Check urine studies -Trend Na -Hold diuretic -Fluid restriction for now  Chest pain ECG unchanged and without ST changes.  Troponins undetectable.  CTA chest negative. Resolved with GI cocktail. - No further work up  History of GI bleed -Continue protonix  Chronic diastolic CHF (congestive heart failure) (HCC) Recent echo with EF 50-55% chest x-ray clear, and BNP normal at 12 Acute CHF ruled out - Patient stopped Lasix on cardiology's advice on 9/26 due to hyponatremia - Hold furosemide - Continue metoprolol  Alcohol use disorder, moderate, dependence (HCC) No signs of withdrawal  Anemia of chronic disease Stabel relative to abseline  Severe protein-calorie malnutrition (HCC) -Continue ensure     Objective Vital signs were reviewed and unremarkable except for: Blood pressure: slightly elevated  BP 139/76 (BP Location: Right  Arm)   Pulse 77   Temp 98.4 F (36.9 C) (Oral)   Resp 17   Ht 5\' 1"  (1.549 m)   Wt 38.3 kg   SpO2 100%   BMI 15.95 kg/m      Exam General appearance: Cachectic elderly female, lying in bed, no acute distress, sleepy but responsive to questions     HEENT: Anicteric, conjunctival pink, lids and lashes normal.  No nasal deformity, discharge, epistaxis.  Oropharynx moist, mucous membranes normal, dentition in adequate repair, lips normal Skin: Warm and dry, no tenting, no suspicious rashes or lesions Cardiac: RRR, no murmurs, no lower extremity edema Respiratory: Normal respiratory rate and rhythm, lungs clear without rales or wheezes Abdomen: Scaphoid without tenderness palpation, guarding, or rigidity MSK: Severe loss of subcutaneous muscle mass and fat diffusely. Neuro: Sleepy but arouses to questions, oriented to hospital, daughter, and situation.  Answers questions appropriately but is very sleepy.  Upper extremity strength is severely weak but symmetric.  Face symmetric, speech fluent. Psych: Attention diminished, sleepy, affect blunted, judgment and insight appear mildly impaired, dementia baseline    Labs / Other Information My review of labs, imaging, notes and other tests is significant for sodium unchanged essentially, anemia noted, CT shows small clinically insignificant pleural effusion, also fibroids, no other findings, CTA chest normal     Time spent: 35 minutes [facility] 07/04/2021, 4:01 PM

## 2021-07-04 NOTE — Progress Notes (Signed)
Dr. Maryfrances Bunnell contacted this morning about the patient's labs of 118 for Na+   Doctor mentions that he is aware and that since patient is stable, he will adjust after the results of her urinalysis this morning comes back.

## 2021-07-04 NOTE — Progress Notes (Signed)
PHARMACIST - PHYSICIAN COMMUNICATION  CONCERNING:  Enoxaparin (Lovenox) for DVT Prophylaxis    RECOMMENDATION: Patient was prescribed enoxaprin 40mg  q24 hours for VTE prophylaxis.   Filed Weights   07/03/21 2132 07/04/21 0034  Weight: 38.1 kg (84 lb) 38.3 kg (84 lb 7 oz)    Body mass index is 15.95 kg/m.  Estimated Creatinine Clearance: 41.3 mL/min (A) (by C-G formula based on SCr of 0.43 mg/dL (L)).   Patient is candidate for enoxaparin 30mg  every 24 hours based on Weight < 45kg  DESCRIPTION: Pharmacy has adjusted enoxaparin dose per [facility] policy.  Patient is now receiving enoxaparin 30 mg every 24 hours    , PharmD Clinical Pharmacist  07/04/2021 7:16 AM

## 2021-07-04 NOTE — Assessment & Plan Note (Signed)
Continue ensure

## 2021-07-04 NOTE — Progress Notes (Signed)
65 year old female. Full code. Admitted from home with complaints of weakness, chest pain, shortness of breath.  AXOX4.   Patient complained of chest pain 6-7 and tightness in her chest at start of shift 7:45am. GI cocktail ordered and Hydrocodone.  Urinalysis to be done this morning.   Patient is NSR at the moment, vitals are normal. Heart healthy diet, CXR ordered shows cardiomegaly but no active cardio disease

## 2021-07-04 NOTE — Hospital Course (Addendum)
Peggy Brown is a 65 y.o. F with history AUD, dCHF, GIB 7/22, pSVT in 8/22 complicated by CHF and respiratory failure who prsented with 1 day chest pain, SOB and fatigue.  In the ER, ECG normal, unchanged from previous and troponins undetectable.  Na 117.  CXR and CTA chest unremarkable.    10/1 admitted for hyponatremia 10/2 hypertonic saline started, Na up to 124 10/3 3% saline stopped, Na up to 126 today 10/4 sodium up to 128, near baseline, mentation and physical function back to baseline

## 2021-07-04 NOTE — Assessment & Plan Note (Signed)
Due to hyponatremia.  Her symptoms are not truly dyspnea, more exertional intolerance

## 2021-07-04 NOTE — Care Plan (Signed)
Patient at risk for ODS  Goal sodium correction overnight is 124 by morning.    If we reach 124, will stop hypertonic saline.    If we reach 126, we will start hypotonic fluids and start desmopressin 0.5 mcg IV q6hrs to keep sodium <126.

## 2021-07-04 NOTE — Assessment & Plan Note (Addendum)
Patient mildly symptomatic.  Oriented but sleepy, weak.  No seizures, no confusion.  Na 117 > 119 > 118 overnight. Appears euvolemic, urine appears light in color, urine studies ordered never sent.  At risk for ODS given alcohol use, underweight.  -Hold fluids -Check urine studies -Trend Na -Hold diuretic -Fluid restriction for now

## 2021-07-05 LAB — CBC
HCT: 28 % — ABNORMAL LOW (ref 36.0–46.0)
Hemoglobin: 9.9 g/dL — ABNORMAL LOW (ref 12.0–15.0)
MCH: 30.7 pg (ref 26.0–34.0)
MCHC: 35.4 g/dL (ref 30.0–36.0)
MCV: 86.7 fL (ref 80.0–100.0)
Platelets: 312 10*3/uL (ref 150–400)
RBC: 3.23 MIL/uL — ABNORMAL LOW (ref 3.87–5.11)
RDW: 13 % (ref 11.5–15.5)
WBC: 7.7 10*3/uL (ref 4.0–10.5)
nRBC: 0 % (ref 0.0–0.2)

## 2021-07-05 LAB — SODIUM
Sodium: 129 mmol/L — ABNORMAL LOW (ref 135–145)
Sodium: 126 mmol/L — ABNORMAL LOW (ref 135–145)
Sodium: 126 mmol/L — ABNORMAL LOW (ref 135–145)
Sodium: 126 mmol/L — ABNORMAL LOW (ref 135–145)

## 2021-07-05 LAB — BASIC METABOLIC PANEL
Anion gap: 7 (ref 5–15)
Anion gap: 8 (ref 5–15)
BUN: 10 mg/dL (ref 8–23)
BUN: 13 mg/dL (ref 8–23)
CO2: 24 mmol/L (ref 22–32)
CO2: 25 mmol/L (ref 22–32)
Calcium: 9.1 mg/dL (ref 8.9–10.3)
Calcium: 9.1 mg/dL (ref 8.9–10.3)
Chloride: 91 mmol/L — ABNORMAL LOW (ref 98–111)
Chloride: 93 mmol/L — ABNORMAL LOW (ref 98–111)
Creatinine, Ser: 0.43 mg/dL — ABNORMAL LOW (ref 0.44–1.00)
Creatinine, Ser: 0.45 mg/dL (ref 0.44–1.00)
GFR, Estimated: >60 mL/min (ref >60)
GFR, Estimated: >60 mL/min (ref >60)
Glucose, Bld: 89 mg/dL (ref 70–99)
Glucose, Bld: 99 mg/dL (ref 70–99)
Potassium: 3.4 mmol/L — ABNORMAL LOW (ref 3.5–5.1)
Potassium: 4 mmol/L (ref 3.5–5.1)
Sodium: 123 mmol/L — ABNORMAL LOW (ref 135–145)
Sodium: 125 mmol/L — ABNORMAL LOW (ref 135–145)

## 2021-07-05 LAB — T4, FREE: Free T4: 0.84 ng/dL (ref 0.61–1.12)

## 2021-07-05 LAB — TSH: TSH: 0.954 u[IU]/mL (ref 0.350–4.500)

## 2021-07-05 MED ORDER — ADULT MULTIVITAMIN W/MINERALS CH
1.0000 | ORAL_TABLET | Freq: Every day | ORAL | Status: DC
  Administered 2021-07-06: 1 via ORAL
  Filled 2021-07-05: qty 1

## 2021-07-05 MED ORDER — POTASSIUM CHLORIDE CRYS ER 20 MEQ PO TBCR
40.0000 meq | EXTENDED_RELEASE_TABLET | Freq: Once | ORAL | Status: AC
  Administered 2021-07-05: 40 meq via ORAL
  Filled 2021-07-05: qty 4

## 2021-07-05 MED ORDER — DEXTROSE 5 % IV SOLN: INTRAVENOUS | Status: DC

## 2021-07-05 NOTE — Evaluation (Signed)
Physical Therapy Evaluation Patient Details Name: Peggy Brown MRN: [id number] DOB: [birth year] Today's Date: 07/05/2021  History of Present Illness  65 y.o. female presents to the emergency room with chest pain and shortness of breath. Medical history significant for ex-alcohol use disorder, GI bleed 04/2021 requiring blood transfusion, pSVT in 05/2021 complicated by CHF exacerbation and respiratory failure, diastolic CHF (EF 32% 04/2021) seen by cardiology on 9/23. Recent admission in 8/22 with similar symptoms.  Clinical Impression  Pt received supine in bed, agreeable to therapy. She appeared drowsy at beginning of session however became more alert with conversation. Pt was oriented to self and place only. She performed bed mobility Mod I. Using RW, she performed STS and 262ft ambulation with CGA for occasional steadying, especially once fatigued. Pt asked multiple questions at end of session - pt education provided on use of RW for increased safety and independence at home. PT also addressed pt questions regarding asking for topical pain meds for knee and ankle. Pt demo impaired strength, standing balance and overall functional endurance. Would benefit from skilled PT to address above deficits and promote optimal return to PLOF.      Recommendations for follow up therapy are one component of a multi-disciplinary discharge planning process, led by the attending physician.  Recommendations may be updated based on patient status, additional functional criteria and insurance authorization.  Follow Up Recommendations Supervision for mobility/OOB;Home health PT    Equipment Recommendations  Rolling walker with 5" wheels    Recommendations for Other Services       Precautions / Restrictions Precautions Precautions: Fall Restrictions Weight Bearing Restrictions: No      Mobility  Bed Mobility Overal bed mobility: Modified Independent             General bed mobility comments:  increased time to complete    Transfers Overall transfer level: Needs assistance Equipment used: Rolling walker (2 wheeled) Transfers: Sit to/from Stand Sit to Stand: Min guard         General transfer comment: CGA to steady  Ambulation/Gait Ambulation/Gait assistance: Min guard Gait Distance (Feet): 200 Feet Assistive device: Rolling walker (2 wheeled) Gait Pattern/deviations: Step-through pattern;Decreased step length - right;Decreased step length - left;Decreased stride length;Narrow base of support;Trunk flexed Gait velocity: decreased   General Gait Details: Using RW, pt demo increased reliance on BUE, decreased step length/foot clearance bilaterally. Limited endurance with fatigue midway through walk.  Stairs            Wheelchair Mobility    Modified Rankin (Stroke Patients Only)       Balance Overall balance assessment: Needs assistance Sitting-balance support: Feet supported;Bilateral upper extremity supported Sitting balance-Leahy Scale: Good Sitting balance - Comments: During LE MMT   Standing balance support: Bilateral upper extremity supported;During functional activity Standing balance-Leahy Scale: Poor Standing balance comment: Required CGA and RW to maintain steadiness                             Pertinent Vitals/Pain Pain Assessment: Faces Faces Pain Scale: Hurts little more Pain Location: R ankle Pain Descriptors / Indicators: Sharp;Shooting Pain Intervention(s): Limited activity within patient's tolerance;Monitored during session;Repositioned    Home Living Family/patient expects to be discharged to:: Private residence Living Arrangements: Children (and high school grandkids) Available Help at Discharge: Family;Available 24 hours/day Type of Home: House Home Access: Level entry     Home Layout: One level Home Equipment: None Additional Comments: Holds onto walls and  furniture to ambulate.    Prior Function Level of  Independence: Independent         Comments: Indep with ADLs; no home O2; 1 fall in the last 6 months during toileting.  Daughter/son-in-law assist with meals and household chores.     Hand Dominance   Dominant Hand: Right    Extremity/Trunk Assessment   Upper Extremity Assessment Upper Extremity Assessment: Generalized weakness    Lower Extremity Assessment Lower Extremity Assessment: Generalized weakness (4-/5 B hip flexors, 5/5 knee ext and DF)       Communication   Communication: No difficulties  Cognition Arousal/Alertness: Awake/alert Behavior During Therapy: WFL for tasks assessed/performed Overall Cognitive Status: No family/caregiver present to determine baseline cognitive functioning                                 General Comments: Oriented to self and place only - unaware of month and year; unaware of details of situation      General Comments      Exercises     Assessment/Plan    PT Assessment Patient needs continued PT services  PT Problem List Decreased strength;Decreased activity tolerance;Decreased balance;Decreased mobility;Decreased safety awareness       PT Treatment Interventions Balance training;Gait training;Neuromuscular re-education;Stair training;Functional mobility training;Therapeutic activities;Therapeutic exercise;Patient/family education    PT Goals (Current goals can be found in the Care Plan section)  Acute Rehab PT Goals Patient Stated Goal: to go home PT Goal Formulation: With patient Time For Goal Achievement: 07/19/21 Potential to Achieve Goals: Good    Frequency Min 2X/week   Barriers to discharge        Co-evaluation               AM-PAC PT "6 Clicks" Mobility  Outcome Measure Help needed turning from your back to your side while in a flat bed without using bedrails?: None Help needed moving from lying on your back to sitting on the side of a flat bed without using bedrails?: None Help needed  moving to and from a bed to a chair (including a wheelchair)?: A Little Help needed standing up from a chair using your arms (e.g., wheelchair or bedside chair)?: A Little Help needed to walk in hospital room?: A Little Help needed climbing 3-5 steps with a railing? : A Little 6 Click Score: 20    End of Session Equipment Utilized During Treatment: Gait belt Activity Tolerance: Patient tolerated treatment well;Patient limited by fatigue Patient left: in chair;with call bell/phone within reach;with chair alarm set Nurse Communication: Mobility status PT Visit Diagnosis: Unsteadiness on feet (R26.81);Difficulty in walking, not elsewhere classified (R26.2);History of falling (Z91.81);Muscle weakness (generalized) (M62.81)    Time: 1340-1430 PT Time Calculation (min) (ACUTE ONLY): 50 min   Charges:   PT Evaluation $PT Eval Moderate Complexity: 1 Mod PT Treatments $Gait Training: 8-22 mins $Therapeutic Activity: 23-37 mins        Basilia Jumbo PT, DPT 07/05/21 3:09 PM [phone]   Lavenia Atlas 07/05/2021, 3:07 PM

## 2021-07-05 NOTE — Progress Notes (Signed)
  Progress Note    Cieara Stierwalt   NKN:[id number]  DOB: Mar 19, 1956  DOA: 07/03/2021     2 Date of Service: 07/05/2021     Brief summary: Peggy Brown is a 65 y.o. F with history AUD, dCHF, GIB 7/22, pSVT in 8/22 complicated by CHF and respiratory failure who prsented with 1 day chest pain, SOB and fatigue.  In the ER, ECG normal, unchanged from previous and troponins undetectable.  Na 117.  CXR and CTA chest unremarkable.    10/1 admitted for hyponatremia 10/2 hypertonic saline started, Na up to 126 10/3 3% saline stopped, Na up to 126-129 today        Subjective:  Strength improving.  Mentation improving.  She is oriented to person, place, and time.  No seizures.  No vomiting.  Able to ambulate with physical therapy.  Hospital Problems * Generalized weakness Due to hyponatremia.  Her symptoms are not truly dyspnea, more exertional intoleranc Improving  Hyponatremia Patient improving.  Na up to mid-120s today.   At risk for ODS given alcohol use, underweight, but we have had a controlled rise in Na so far.  - Fluid restriction to 1200 cc daily  - Stop 3% saline - Hold IV fluids - Keep q4hrs Na checks - Goal Na 130-132 tomorrow    Chest pain ECG unchanged and without ST changes.  Troponins undetectable.  CTA chest negative. Resolved with GI cocktail. - No further work up  History of GI bleed - Continue protonix  Chronic diastolic CHF (congestive heart failure) (HCC) Recent echo with EF 50-55% chest x-ray clear, and BNP normal at 12 Acute CHF ruled out   - Continue metoprolol  Alcohol use disorder, moderate, dependence (HCC) No signs of withdrawal.  Reports to me she only drinks non-alcoholic beer.    Anemia of chronic disease Stabel relative to abseline  Severe protein-calorie malnutrition (HCC) -Continue ensure     Objective Vital signs were reviewed and unremarkable.  Vitals:   07/05/21 0827 07/05/21 1158 07/05/21 1717 07/05/21 1925  BP:  134/71 (!) 148/80 (!) 175/91 (!) 159/72  Pulse: 75 82 75 88  Resp: 17 17 18 18   Temp: 97.7 F (36.5 C) 97.8 F (36.6 C) 97.7 F (36.5 C) 98.1 F (36.7 C)  TempSrc:    Oral  SpO2: 100% 100% 100% 100%  Weight:      Height:       38.3 kg   Exam General appearance: Cachectic elderly female, sitting up in recliner, no acute distress     HEENT: Anicteric, conjunctival pink, lids and lashes normal.  No nasal deformity, discharge, or epistaxis. Skin: Skin warm and dry, no suspicious rashes or lesions. Cardiac: RRR, no murmurs, no lower extremity edema Respiratory: Respiratory rate and rhythm, lungs clear without rales or wheezes Abdomen: Abdomen soft without tenderness palpation or guarding, no ascites or distention MSK: Severely reduced muscle mass and fat, diffusely. Neuro: Awake and alert, strength 5/5 in upper and lower extremities bilaterally.  Attention normal, speech fluent, face symmetric. Psych: Attention normal, affect appropriate, judgment insight appear normal, mild psychomotor slowing.       Labs / Other Information My review of labs, imaging, notes and other tests is significant for Potassium 3.4, hemoglobin down to 9, sodium up to 126     Time spent: 35 minutes [facility] 07/05/2021, 8:52 PM

## 2021-07-05 NOTE — Assessment & Plan Note (Signed)
Patient improving.  Na up to mid-120s today.   At risk for ODS given alcohol use, underweight, but we have had a controlled rise in Na so far.  - Fluid restriction to 1200 cc daily  - Stop 3% saline - Hold IV fluids - Keep q4hrs Na checks - Goal Na 130-132 tomorrow

## 2021-07-05 NOTE — Assessment & Plan Note (Signed)
No signs of withdrawal.  Reports to me she only drinks non-alcoholic beer.   

## 2021-07-05 NOTE — Assessment & Plan Note (Signed)
Continue protonix  

## 2021-07-05 NOTE — Consult Note (Signed)
MEDICATION RELATED CONSULT NOTE - INITIAL   Pharmacy Consult for Hypertonic saline monitoring  Indication: Hyponatremia  No Known Allergies  Patient Measurements: Height: 5\' 1"  (154.9 cm) Weight: 38.3 kg (84 lb 7 oz) IBW/kg (Calculated) : 47.8   Vital Signs: Temp: 98.1 F (36.7 C) (10/03 0431) Temp Source: Oral (10/02 2011) BP: 141/70 (10/03 0431) Pulse Rate: 83 (10/03 0431) Intake/Output from previous day: 10/02 0701 - 10/03 0700 In: 1080 [P.O.:960] Out: 1100 [Urine:1100] Intake/Output from this shift: No intake/output data recorded.  Labs: Recent Labs    07/03/21 2139 07/04/21 0141 07/04/21 1744 07/05/21 0024 07/05/21 0340  WBC 11.3*  --   --   --  7.7  HGB 11.1*  --   --   --  9.9*  HCT 29.9*  --   --   --  28.0*  PLT 294  --   --   --  312  CREATININE 0.41*   < > 0.55 0.45 0.43*  ALBUMIN 3.2*  --   --   --   --   PROT 6.6  --   --   --   --   AST 27  --   --   --   --   ALT 14  --   --   --   --   ALKPHOS 125  --   --   --   --   BILITOT 0.7  --   --   --   --    < > = values in this interval not displayed.    Estimated Creatinine Clearance: 41.3 mL/min (A) (by C-G formula based on SCr of 0.43 mg/dL (L)).   Microbiology: Recent Results (from the past 720 hour(s))  Resp Panel by RT-PCR (Flu A&B, Covid) Nasopharyngeal Swab     Status: None   Collection Time: 07/03/21  9:37 PM   Specimen: Nasopharyngeal Swab; Nasopharyngeal(NP) swabs in vial transport medium  Result Value Ref Range Status   SARS Coronavirus 2 by RT PCR NEGATIVE NEGATIVE Final    Comment: (NOTE) SARS-CoV-2 target nucleic acids are NOT DETECTED.  The SARS-CoV-2 RNA is generally detectable in upper respiratory specimens during the acute phase of infection. The lowest concentration of SARS-CoV-2 viral copies this assay can detect is 138 copies/mL. A negative result does not preclude SARS-Cov-2 infection and should not be used as the sole basis for treatment or other patient management  decisions. A negative result may occur with  improper specimen collection/handling, submission of specimen other than nasopharyngeal swab, presence of viral mutation(s) within the areas targeted by this assay, and inadequate number of viral copies(<138 copies/mL). A negative result must be combined with clinical observations, patient history, and epidemiological information. The expected result is Negative.  Fact Sheet for Patients:  09/02/21  Fact Sheet for Healthcare Providers:  BloggerCourse.com  This test is no t yet approved or cleared by the [place] FDA and  has been authorized for detection and/or diagnosis of SARS-CoV-2 by FDA under an Emergency Use Authorization (EUA). This EUA will remain  in effect (meaning this test can be used) for the duration of the COVID-19 declaration under Section 564(b)(1) of the Act, 21 U.S.C.section 360bbb-3(b)(1), unless the authorization is terminated  or revoked sooner.       Influenza A by PCR NEGATIVE NEGATIVE Final   Influenza B by PCR NEGATIVE NEGATIVE Final    Comment: (NOTE) The Xpert Xpress SARS-CoV-2/FLU/RSV plus assay is intended as an aid in the diagnosis of  influenza from Nasopharyngeal swab specimens and should not be used as a sole basis for treatment. Nasal washings and aspirates are unacceptable for Xpert Xpress SARS-CoV-2/FLU/RSV testing.  Fact Sheet for Patients: BloggerCourse.com  Fact Sheet for Healthcare Providers: SeriousBroker.it  This test is not yet approved or cleared by the [place] FDA and has been authorized for detection and/or diagnosis of SARS-CoV-2 by FDA under an Emergency Use Authorization (EUA). This EUA will remain in effect (meaning this test can be used) for the duration of the COVID-19 declaration under Section 564(b)(1) of the Act, 21 U.S.C. section 360bbb-3(b)(1), unless the  authorization is terminated or revoked.  Performed at [facility], [address] [place], [place] [address]     Medical History: Past Medical History:  Diagnosis Date   Anemia    CHF (congestive heart failure) (HCC)    SVT (supraventricular tachycardia) (HCC)     Assessment: 65 y.o. F with history of alcohol use disorder, CHF, and GIB who prsented with 1 day chest pain, SOB and fatigue. Pt found to have a sodium of 119. At risk for ODS given alcohol use. Pharmacy has been consulted for concentrated sodium chloride monitoring.   Date Time Na 10/2 1744 120 10/3 0024 123 10/3 0340 125 10/3 0733 129 10/3 1201 126  Goal of Therapy:  Increase Na by 4-6 mEq/L in 4-6 hours Per MD, Goal sodium correction overnight is 124  Plan:  --Hypertonic saline was discontinued following the 129 reading and D5 was started to lower the sodium --Pharmacy will sign off following discontinuation of hypertonic saline, please reengage pharmacy if additional monitoring is needed    Doloris Hall, PharmD Pharmacy Resident  07/05/2021 1:34 PM

## 2021-07-05 NOTE — Progress Notes (Signed)
Initial Nutrition Assessment  DOCUMENTATION CODES:   Severe malnutrition in context of social or environmental circumstances  INTERVENTION:   Ensure Enlive po BID, each supplement provides 350 kcal and 20 grams of protein  Magic cup TID with meals, each supplement provides 290 kcal and 9 grams of protein  MVI po daily   Liberalize diet   Pt at high refeed risk; recommend monitor potassium, magnesium and phosphorus labs daily until stable  NUTRITION DIAGNOSIS:   Severe Malnutrition related to social / environmental circumstances (etoh abuse) as evidenced by severe muscle depletion, severe fat depletion.  GOAL:   Patient will meet greater than or equal to 90% of their needs  MONITOR:   PO intake, Supplement acceptance, Labs, Weight trends, Skin, I & O's  REASON FOR ASSESSMENT:   Malnutrition Screening Tool    ASSESSMENT:   65 y/o female with h/o etoh abuse (quit drinking July 2022), anemia, CHF, SVT and GIB who is admitted with hyponatremia and weakness.  Visited pt's room today. Pt sitting in chair at time of RD visit. Pt reports fairly good appetite and oral intake pta and in hospital; pt ate 60% of her breakfast today and reports that she is drinking the Ensure supplements. RD suspects pt with poor appetite and oral intake at baseline as pt with chronic malnutrition. Per chart, pt is down 10lbs(11%) over the past 2 months; this is significant. Recommend continue the Ensure supplements. RD will add Magic Cups to meal trays and liberalize pt's diet. Pt is likely at refeed risk.    Medications reviewed and include: lovenox, protonix  Labs reviewed: Na 126(L), K 3.4(L), creat 0.43(L) Hgb 9.9(L), Hct 28.0(L)  NUTRITION - FOCUSED PHYSICAL EXAM:  Flowsheet Row Most Recent Value  Orbital Region Moderate depletion  Upper Arm Region Severe depletion  Thoracic and Lumbar Region Severe depletion  Buccal Region Moderate depletion  Temple Region Moderate depletion  Clavicle  Bone Region Severe depletion  Clavicle and Acromion Bone Region Severe depletion  Scapular Bone Region Severe depletion  Dorsal Hand Severe depletion  Patellar Region Severe depletion  Anterior Thigh Region Severe depletion  Posterior Calf Region Severe depletion  Edema (RD Assessment) None  Hair Reviewed  Eyes Reviewed  Mouth Reviewed  Skin Reviewed  Nails Reviewed   Diet Order:   Diet Order             Diet Heart Room service appropriate? Yes; Fluid consistency: Thin; Fluid restriction: 1200 mL Fluid  Diet effective now                  EDUCATION NEEDS:   No education needs have been identified at this time  Skin:  Skin Assessment: Reviewed RN Assessment  Last BM:  10/2- TYPE 4  Height:   Ht Readings from Last 1 Encounters:  07/04/21 5\' 1"  (1.549 m)    Weight:   Wt Readings from Last 1 Encounters:  07/04/21 38.3 kg    Ideal Body Weight:  47.7 kg  BMI:  Body mass index is 15.95 kg/m.  Estimated Nutritional Needs:   Kcal:  1300-1500kcal/day  Protein:  65-75g/day  Fluid:  1-1.2L/day  09/03/21 MS, RD, LDN Please refer to [facility] for RD and/or RD on-call/weekend/after hours pager

## 2021-07-05 NOTE — Assessment & Plan Note (Signed)
Continue ensure

## 2021-07-05 NOTE — Assessment & Plan Note (Signed)
Due to hyponatremia.  Her symptoms are not truly dyspnea, more exertional intoleranc Improving

## 2021-07-05 NOTE — Assessment & Plan Note (Signed)
Recent echo with EF 50-55% chest x-ray clear, and BNP normal at 12 Acute CHF ruled out   - Continue metoprolol

## 2021-07-05 NOTE — Assessment & Plan Note (Signed)
Stabel relative to abseline 

## 2021-07-05 NOTE — Assessment & Plan Note (Signed)
ECG unchanged and without ST changes.  Troponins undetectable.  CTA chest negative. Resolved with GI cocktail. - No further work up 

## 2021-07-06 DIAGNOSIS — E871 Hypo-osmolality and hyponatremia: Principal | ICD-10-CM

## 2021-07-06 LAB — BASIC METABOLIC PANEL
Anion gap: 5 (ref 5–15)
BUN: 8 mg/dL (ref 8–23)
CO2: 26 mmol/L (ref 22–32)
Calcium: 9.4 mg/dL (ref 8.9–10.3)
Chloride: 97 mmol/L — ABNORMAL LOW (ref 98–111)
Creatinine, Ser: 0.46 mg/dL (ref 0.44–1.00)
GFR, Estimated: >60 mL/min (ref >60)
Glucose, Bld: 92 mg/dL (ref 70–99)
Potassium: 3.9 mmol/L (ref 3.5–5.1)
Sodium: 128 mmol/L — ABNORMAL LOW (ref 135–145)

## 2021-07-06 MED ORDER — POTASSIUM CHLORIDE CRYS ER 20 MEQ PO TBCR: 20.0000 meq | EXTENDED_RELEASE_TABLET | Freq: Every day | ORAL | 3 refills | Status: DC

## 2021-07-06 MED ORDER — POTASSIUM CHLORIDE CRYS ER 10 MEQ PO TBCR: 10.0000 meq | EXTENDED_RELEASE_TABLET | Freq: Every day | ORAL | 3 refills | Status: AC

## 2021-07-06 NOTE — Assessment & Plan Note (Signed)
Due to hyponatremia. Improved to baseline.

## 2021-07-06 NOTE — Assessment & Plan Note (Signed)
No signs of withdrawal.  Reports to me she only drinks non-alcoholic beer.

## 2021-07-06 NOTE — Assessment & Plan Note (Signed)
Recent echo with EF 50-55% Chest x-ray clear, and BNP normal at 12 Acute CHF ruled out Appeared euvolemic.  Furosemide held.

## 2021-07-06 NOTE — TOC Initial Note (Signed)
Transition of Care [facility]) - Initial/Assessment Note    Patient Details  Name: Peggy Brown MRN: [id number] Date of Birth: 09-17-1956  Transition of [facility] CM/SW Contact:    Chapman Fitch, RN Phone Number: 07/06/2021, 12:47 PM  Clinical Narrative:                  Patient admitted from home with weakness Patient lives at home with daughter  PCP Shellia Cleverly with [facility]  Denies issues obtaining medications  Patient open with [facility] for RN, and PT  Home health orders for RN, PT, OT .  Pearson Grippe with [facility] notified RW was delivered to room by [facility] Daughter to transport at discharge Expected Discharge Plan: Home w Home Health Services Barriers to Discharge: Barriers Resolved   Patient Goals and CMS Choice        Expected Discharge Plan and Services Expected Discharge Plan: Home w Home Health Services   Discharge Planning Services: CM Consult   Living arrangements for the past 2 months: Single Family Home Expected Discharge Date: 07/06/21               DME Arranged: Dan Humphreys rolling   Date DME Agency Contacted: 07/06/21   Representative spoke with at DME Agency: Pamelia Hoit HH Arranged: RN, PT, OT [facility] Agency: [facility] Date HH Agency Contacted: 07/06/21   Representative spoke with at [facility] Agency: Pearson Grippe  Prior Living Arrangements/Services Living arrangements for the past 2 months: Single Family Home Lives with:: Adult Children Patient language and need for interpreter reviewed:: Yes Do you feel safe going back to the place where you live?: Yes      Need for Family Participation in Patient Care: Yes (Comment) Care giver support system in place?: Yes (comment) Current home services: Home PT, Home RN Criminal Activity/Legal Involvement Pertinent to Current Situation/Hospitalization: No - Comment as needed  Activities of Daily Living Home Assistive Devices/Equipment: None ADL Screening (condition at time  of admission) Patient's cognitive ability adequate to safely complete daily activities?: Yes Is the patient deaf or have difficulty hearing?: No Does the patient have difficulty seeing, even when wearing glasses/contacts?: Yes Does the patient have difficulty concentrating, remembering, or making decisions?: No Patient able to express need for assistance with ADLs?: Yes Does the patient have difficulty dressing or bathing?: Yes Independently performs ADLs?: Yes (appropriate for developmental age) Does the patient have difficulty walking or climbing stairs?: Yes Weakness of Legs: Both Weakness of Arms/Hands: Both  Permission Sought/Granted                  Emotional Assessment Appearance:: Appears stated age     Orientation: : Oriented to Self, Oriented to Place, Oriented to  Time, Oriented to Situation Alcohol / Substance Use: Not Applicable Psych Involvement: No (comment)  Admission diagnosis:  Hyponatremia [E87.1] Weakness [R53.1] Atypical chest pain [R07.89] Patient Active Problem List   Diagnosis Date Noted   Alcohol use disorder, moderate, dependence (HCC) 07/03/2021   Generalized weakness 07/03/2021   Chronic diastolic CHF (congestive heart failure) (HCC) 07/03/2021   History of GI bleed 07/03/2021   Anemia of chronic disease    Thrombocytopenia (HCC)    Alcohol abuse    SVT (supraventricular tachycardia) (HCC) 04/25/2021   Severe protein-calorie malnutrition (HCC) 04/21/2018   Hyponatremia 04/19/2018   PCP:  Patient, No Pcp Per (Inactive) Pharmacy:   [facility] [place], [place] - [address] [place]  [address] Phone: [phone] Fax: [phone]  [facility] [address]), [place] - [address] ([address]) [place] [address] Phone: [phone] Fax: [phone]     Social Determinants of Health (SDOH) Interventions    Readmission Risk  Interventions No flowsheet data found.

## 2021-07-06 NOTE — Care Management Important Message (Signed)
Important Message  Patient Details  Name: Peggy Brown MRN: [id number] Date of Birth: [birth year]   Medicare Important Message Given:  No  Patient discharged prior to arrival to unit to deliver concurrent Medicare IM.      Johnell Comings 07/06/2021, 2:15 PM

## 2021-07-06 NOTE — Assessment & Plan Note (Signed)
Stable relative to baseline 

## 2021-07-06 NOTE — Assessment & Plan Note (Signed)
As evidenced by severe loss of subcutaneous muscle mass and subcutaneous fat, reported low intake.  Started on Ensure.

## 2021-07-06 NOTE — Assessment & Plan Note (Signed)
Admitted and started on hypertonic saline.  Sodium brought up gradually.  128 by day of discharge, mentation back to baseline

## 2021-07-06 NOTE — Progress Notes (Signed)
Educated patient and patient's daughter on after-visit summary and medications.  Patient is aware to stop taking her lasix and begin taking potassium supplements.  Potassium prescription sent to the [facility] pharmacy off [address] in [place], [place] per patient request.  All questions answered.  All patient belongings (cell phone, phone charger and purse) in patient's possession.  Patient daughter took rolling walker to car.  Patient left unit in wheelchair, alert and oriented.  Transporting to home via daughter's private vehicle.

## 2021-07-06 NOTE — Progress Notes (Signed)
Physical Therapy Treatment Patient Details Name: Peggy Brown MRN: [id number] DOB: [birth year] Today's Date: 07/06/2021   History of Present Illness 65 y.o. female presents to the emergency room with chest pain and shortness of breath. Medical history significant for ex-alcohol use disorder, GI bleed 04/2021 requiring blood transfusion, pSVT in 05/2021 complicated by CHF exacerbation and respiratory failure, diastolic CHF (EF 61% 04/2021) seen by cardiology on 9/23. Recent admission in 8/22 with similar symptoms.    PT Comments    Pt received sitting in recliner, RN in room. Pt had just finished toileting upon arrival. Pt agreeable to therapy. Pt was able to maintain ambulation distance today however required increased standing rest breaks compared to evaluation. Pt did report R wrist pain on multiple occasions with WB through RUE on RW. PT VC on decreased WB through RUE, pt unable to maintain VC through entire 225ft walk. Fire drill did startle pt during ambulation requiring pt a couple minutes of standing recovery time. Pt reported significant fatigue upon completion. She does demo decreased activity tolerance. Continuing to rec d/c home with HHPT, RW, and SUP with all standing mobility. Would benefit from skilled PT to address above deficits and promote optimal return to PLOF.    Recommendations for follow up therapy are one component of a multi-disciplinary discharge planning process, led by the attending physician.  Recommendations may be updated based on patient status, additional functional criteria and insurance authorization.  Follow Up Recommendations  Supervision for mobility/OOB;Home health PT     Equipment Recommendations  Rolling walker with 5" wheels    Recommendations for Other Services       Precautions / Restrictions Precautions Precautions: Fall Restrictions Weight Bearing Restrictions: No     Mobility  Bed Mobility               General bed mobility  comments: Arrived with pt in recliner    Transfers Overall transfer level: Needs assistance Equipment used: Rolling walker (2 wheeled) Transfers: Sit to/from Stand Sit to Stand: Supervision         General transfer comment: SUP to stand using RW  Ambulation/Gait Ambulation/Gait assistance: Min guard Gait Distance (Feet): 200 Feet Assistive device: Rolling walker (2 wheeled) Gait Pattern/deviations: Step-through pattern;Decreased step length - right;Decreased step length - left;Decreased stride length;Narrow base of support;Trunk flexed Gait velocity: decreased   General Gait Details: Using RW, pt demo increased reliance on BUE reporting pain in R wrist and ultimately decreased RUE support. Decreased step length/foot clearance bilaterally. Limited endurance with multiple standing rest breaks.   Stairs             Wheelchair Mobility    Modified Rankin (Stroke Patients Only)       Balance Overall balance assessment: Needs assistance Sitting-balance support: Feet supported Sitting balance-Leahy Scale: Good     Standing balance support: Bilateral upper extremity supported;During functional activity Standing balance-Leahy Scale: Poor Standing balance comment: Required CGA and RW to maintain steadiness                            Cognition Arousal/Alertness: Awake/alert Behavior During Therapy: WFL for tasks assessed/performed Overall Cognitive Status: No family/caregiver present to determine baseline cognitive functioning                                        Exercises  General Comments        Pertinent Vitals/Pain Faces Pain Scale: Hurts little more Pain Location: R wrist    Home Living                      Prior Function            PT Goals (current goals can now be found in the care plan section) Acute Rehab PT Goals Patient Stated Goal: to go home PT Goal Formulation: With patient Time For Goal  Achievement: 07/19/21 Potential to Achieve Goals: Good    Frequency    Min 2X/week      PT Plan      Co-evaluation              AM-PAC PT "6 Clicks" Mobility   Outcome Measure  Help needed turning from your back to your side while in a flat bed without using bedrails?: None Help needed moving from lying on your back to sitting on the side of a flat bed without using bedrails?: None Help needed moving to and from a bed to a chair (including a wheelchair)?: A Little Help needed standing up from a chair using your arms (e.g., wheelchair or bedside chair)?: A Little Help needed to walk in hospital room?: A Little Help needed climbing 3-5 steps with a railing? : A Little 6 Click Score: 20    End of Session Equipment Utilized During Treatment: Gait belt Activity Tolerance: Patient tolerated treatment well;Patient limited by fatigue Patient left: in chair;with call bell/phone within reach;with chair alarm set Nurse Communication: Mobility status PT Visit Diagnosis: Unsteadiness on feet (R26.81);Difficulty in walking, not elsewhere classified (R26.2);History of falling (Z91.81);Muscle weakness (generalized) (M62.81)     Time: 0981-1914 PT Time Calculation (min) (ACUTE ONLY): 32 min  Charges:  $Gait Training: 8-22 mins $Therapeutic Activity: 8-22 mins                      Basilia Jumbo PT, DPT 07/06/21 11:28 AM [phone]    Peggy Brown 07/06/2021, 11:23 AM

## 2021-07-06 NOTE — Assessment & Plan Note (Signed)
ECG unchanged and without ST changes.  Troponins undetectable.  CTA chest negative. Resolved with GI cocktail. - No further work up

## 2021-07-06 NOTE — Discharge Summary (Signed)
Physician Discharge Summary   Patient name: Peggy Brown  Admit date:     07/03/2021  Discharge date: 07/06/2021  Attending Physician: Peggy Brown [[id number]]  Discharge Physician: Peggy Brown   PCP: Peggy Brown    Recommendations at discharge:  Follow up with PCP Peggy Brown in 1-2 weeks Peggy Brown: Please check BMP to eval Na in 1 week, trend as needed; patient on fluid restriction temporarily while Na resolving        Follow-up Information     Peggy Brown. Schedule an appointment as soon as possible for a visit in 1 week(s).   Why: Call Peggy Brown at Peggy Brown for an appointment Contact information: [phone]                   Principal discharge diagnosis: Hyponatremia  Discharge Diagnoses   Hyponatremia   Acute metabolic encephalopathy due to hyponatremia   Chronic diastolic CHF (congestive heart failure) (HCC)   History of GI bleed   Alcohol use disorder, moderate, dependence (HCC)   Anemia of chronic disease   Severe protein-calorie malnutrition Peggy Brown)          Brown Course   Peggy Brown is a 65 y.o. F with history AUD, dCHF, GIB 7/22, pSVT in 8/22 complicated by CHF and respiratory failure who prsented with 1 day chest pain, SOB and fatigue.  In the ER, ECG normal, unchanged from previous and troponins undetectable.  Na 117.  CXR and CTA chest unremarkable.    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  On admission, she was somnolent, disoriented.  Due to hyponatremia. This resolved to baseline with correction of sodium.   Hyponatremia Admitted and started on hypertonic saline.   Sodium brought up gradually.  128 by day of discharge, mentation back to baseline         Chronic diastolic CHF (congestive heart failure) (HCC) Recent echo with EF 50-55% Chest x-ray clear, and BNP normal at 12 Acute CHF ruled out Appeared euvolemic.  Furosemide held.     Cold intolerance Noted marked cold intolerance.  Long-standing.  TSH and free T4 checked, normal.  Likely due to malnutrition, low weight.       Alcohol use disorder, moderate, dependence (HCC) No signs of withdrawal.  Reports to me she only drinks non-alcoholic beer.    History of GI bleed  Chest pain-resolved as of 07/06/2021 ECG unchanged and without ST changes.  Troponins undetectable.  CTA chest negative. Resolved with GI cocktail.  Anemia of chronic disease  Severe protein-calorie malnutrition (HCC) As evidenced by severe loss of subcutaneous muscle mass and subcutaneous fat, reported low intake.  Started on Ensure.             Condition at discharge: fair  Exam Physical Exam Vitals reviewed.  Constitutional:      General: She is not in acute distress.    Appearance: Normal appearance. She is not ill-appearing or toxic-appearing.  HENT:     Head: Normocephalic and atraumatic.     Mouth/Throat:     Mouth: Mucous membranes are moist.     Pharynx: No oropharyngeal exudate or posterior oropharyngeal erythema.  Cardiovascular:  Rate and Rhythm: Normal rate and regular rhythm.     Heart sounds: No murmur heard. Pulmonary:     Effort: Pulmonary effort is normal.     Breath sounds: Normal breath sounds. No wheezing or rales.  Abdominal:     General: Abdomen is flat.     Palpations: Abdomen is soft.     Tenderness: There is no abdominal tenderness. There is no guarding.  Musculoskeletal:     Comments: Severely reduced diffuse subcutaneous muscle mass and fat, no deformity or effusion  Skin:    General: Skin is warm and dry.     Capillary Refill: Capillary refill takes less than 2  seconds.  Neurological:     General: No focal deficit present.     Mental Status: She is alert and oriented to person, place, and time.     Motor: No weakness.  Psychiatric:        Mood and Affect: Mood normal.        Behavior: Behavior normal.        Thought Content: Thought content normal.     Disposition: Home health  Discharge time: less than 30 minutes. Allergies as of 07/06/2021   No Known Allergies      Medication List     STOP taking these medications    furosemide 20 MG tablet Commonly known as: LASIX       TAKE these medications    acetaminophen 500 MG tablet Commonly known as: TYLENOL Take 1,000 mg by mouth every 8 (eight) hours as needed for moderate pain or mild pain.   feeding supplement Liqd Take 237 mLs by mouth 2 (two) times daily between meals.   metoprolol succinate 25 MG 24 hr tablet Commonly known as: TOPROL-XL Take 1 tablet (25 mg total) by mouth daily.   multivitamin with minerals Tabs tablet Take 1 tablet by mouth daily.   nicotine 14 mg/24hr patch Commonly known as: NICODERM CQ - dosed in mg/24 hours Place 1 patch (14 mg total) onto the skin daily.   pantoprazole 40 MG tablet Commonly known as: Protonix Take 1 tablet (40 mg total) by mouth 2 (two) times daily.   potassium chloride SA 20 MEQ tablet Commonly known as: KLOR-CON Take 1 tablet (20 mEq total) by mouth daily. Take 2 tablets once daily What changed: how much to take        CT Angio Chest Pulmonary Embolism (PE) W or WO Contrast  Result Date: 07/04/2021 CLINICAL DATA:  Intermediate to low probability for pulmonary embolism EXAM: CT ANGIOGRAPHY CHEST WITH CONTRAST TECHNIQUE: Multidetector CT imaging of the chest was performed using the standard protocol during bolus administration of intravenous contrast. Multiplanar CT image reconstructions and MIPs were obtained to evaluate the vascular anatomy. CONTRAST:  88mL OMNIPAQUE IOHEXOL 350 MG/ML SOLN COMPARISON:  04/27/2021  FINDINGS: Cardiovascular: Satisfactory opacification of the pulmonary arteries to the segmental level. No evidence of pulmonary embolism. Normal heart size. No pericardial effusion. Aortic atheromatous calcification. Mediastinum/Nodes: Negative for mass or adenopathy. Lungs/Pleura: There is no edema, consolidation, effusion, or pneumothorax. Centrilobular emphysema at the apices. Upper Abdomen: Cholelithiasis Musculoskeletal: Negative Review of the MIP images confirms the above findings. IMPRESSION: 1. Negative for pulmonary embolism or other acute finding. 2. Aortic Atherosclerosis (ICD10-I70.0) and Emphysema (ICD10-J43.9). Electronically Signed   By: Tiburcio Pea M.D.   On: 07/04/2021 04:15   DG Chest Portable 1 View  Result Date: 07/03/2021 CLINICAL DATA:  Acute onset of chest pain and shortness of breath several hours  ago. EXAM: PORTABLE CHEST 1 VIEW COMPARISON:  05/11/2021 FINDINGS: Stable mild cardiomegaly. Aortic atherosclerotic calcification noted. Both lungs are well aerated and clear. IMPRESSION: Stable mild cardiomegaly. No active lung disease. Electronically Signed   By: Danae Orleans M.D.   On: 07/03/2021 22:03   Results for orders placed or performed during the Brown encounter of 07/03/21  Resp Panel by RT-PCR (Flu A&B, Covid) Nasopharyngeal Swab     Status: None   Collection Time: 07/03/21  9:37 PM   Specimen: Nasopharyngeal Swab; Nasopharyngeal(NP) swabs in vial transport medium  Result Value Ref Range Status   SARS Coronavirus 2 by RT PCR NEGATIVE NEGATIVE Final    Comment: (NOTE) SARS-CoV-2 target nucleic acids are NOT DETECTED.  The SARS-CoV-2 RNA is generally detectable in upper respiratory specimens during the acute phase of infection. The lowest concentration of SARS-CoV-2 viral copies this assay can detect is 138 copies/mL. A negative result does not preclude SARS-Cov-2 infection and should not be used as the sole basis for treatment or other patient management  decisions. A negative result may occur with  improper specimen collection/handling, submission of specimen other than nasopharyngeal swab, presence of viral mutation(s) within the areas targeted by this assay, and inadequate number of viral copies(<138 copies/mL). A negative result must be combined with clinical observations, patient history, and epidemiological information. The expected result is Negative.  Fact Sheet for Patients:  BloggerCourse.com  Fact Sheet for Healthcare Providers:  SeriousBroker.it  This test is no t yet approved or cleared by the [place] FDA and  has been authorized for detection and/or diagnosis of SARS-CoV-2 by FDA under an Emergency Use Authorization (EUA). This EUA will remain  in effect (meaning this test can be used) for the duration of the COVID-19 declaration under Section 564(b)(1) of the Act, 21 U.S.C.section 360bbb-3(b)(1), unless the authorization is terminated  or revoked sooner.       Influenza A by PCR NEGATIVE NEGATIVE Final   Influenza B by PCR NEGATIVE NEGATIVE Final    Comment: (NOTE) The Xpert Xpress SARS-CoV-2/FLU/RSV plus assay is intended as an aid in the diagnosis of influenza from Nasopharyngeal swab specimens and should not be used as a sole basis for treatment. Nasal washings and aspirates are unacceptable for Xpert Xpress SARS-CoV-2/FLU/RSV testing.  Fact Sheet for Patients: BloggerCourse.com  Fact Sheet for Healthcare Providers: SeriousBroker.it  This test is not yet approved or cleared by the [place] FDA and has been authorized for detection and/or diagnosis of SARS-CoV-2 by FDA under an Emergency Use Authorization (EUA). This EUA will remain in effect (meaning this test can be used) for the duration of the COVID-19 declaration under Section 564(b)(1) of the Act, 21 U.S.C. section 360bbb-3(b)(1), unless the  authorization is terminated or revoked.  Performed at [facility], [address] [place] [place], [place] [address]     Signed:  Alberteen Sam MD.  [facility] 07/06/2021, 9:40 AM

## 2021-07-21 ENCOUNTER — Ambulatory Visit: Payer: Medicare Other | Admitting: Internal Medicine

## 2021-08-06 ENCOUNTER — Ambulatory Visit: Payer: Medicare Other | Admitting: Family

## 2021-08-08 NOTE — Progress Notes (Deleted)
Patient ID: Peggy Brown, female    DOB: 1956-04-14, 65 y.o.   MRN: [id number] HPI  Ms Peggy Brown is a 65 y/o female with a history of anemia, SVT, current tobacco use and chronic heart failure.   Echo report from 04/28/21 reviewed and showed an EF of 55% along with mild MR without LVH.   Admitted 07/03/21 due to CP, SOB and fatigue. Sodium was 117. IVF started. Sodium trended up with improvement of mental status. Acute HF ruled out with BNP of 12. Discharged after 3 days. Admitted 05/11/21 due to shortness of breath, weakness and fatigue. Was in SVT and given adenosine as well as IV cardizem. Cardiology consult obtained. Weaned off oxygen. Initially given IV lasix with transition to oral diuretics. Hypokalemia replaced.  Discharged after 2 days.   She presents today for a follow-up visit with a chief complaint of   Past Medical History:  Diagnosis Date   Anemia    CHF (congestive heart failure) (HCC)    SVT (supraventricular tachycardia) (HCC)    Past Surgical History:  Procedure Laterality Date   CESAREAN SECTION     COLONOSCOPY N/A 04/27/2021   Procedure: COLONOSCOPY;  Surgeon: Regis Bill, MD;  Location: [facility] ENDOSCOPY;  Service: Endoscopy;  Laterality: N/A;   ESOPHAGOGASTRODUODENOSCOPY N/A 04/27/2021   Procedure: ESOPHAGOGASTRODUODENOSCOPY (EGD);  Surgeon: Regis Bill, MD;  Location: [facility] ENDOSCOPY;  Service: Endoscopy;  Laterality: N/A;   Family History  Problem Relation Age of Onset   Hypertension Mother    Thyroid cancer Mother    COPD Father    Social History   Tobacco Use   Smoking status: Every Day    Packs/day: 0.50    Years: 45.00    Pack years: 22.50    Types: Cigarettes   Smokeless tobacco: Never  Substance Use Topics   Alcohol use: Yes    Alcohol/week: 2.0 standard drinks    Types: 2 Cans of beer per week   No Known Allergies   Review of Systems  Constitutional:  Positive for fatigue. Negative for appetite change.  HENT:  Negative for  congestion, postnasal drip and sore throat.   Eyes: Negative.   Respiratory:  Positive for cough (dry cough) and shortness of breath. Negative for chest tightness.   Cardiovascular:  Negative for chest pain, palpitations and leg swelling.  Gastrointestinal:  Negative for abdominal distention and abdominal pain.  Endocrine: Negative.   Genitourinary: Negative.   Musculoskeletal:  Positive for arthralgias (knee pain). Negative for back pain.  Skin: Negative.   Allergic/Immunologic: Negative.   Neurological:  Negative for dizziness and light-headedness.  Hematological:  Negative for adenopathy. Does not bruise/bleed easily.  Psychiatric/Behavioral:  Negative for dysphoric mood and sleep disturbance (sleeping on 1 pillow). The patient is not nervous/anxious.      Physical Exam Vitals and nursing note reviewed.  Constitutional:      Appearance: Normal appearance.  HENT:     Head: Normocephalic and atraumatic.  Cardiovascular:     Rate and Rhythm: Normal rate and regular rhythm.  Pulmonary:     Effort: Pulmonary effort is normal. No respiratory distress.     Breath sounds: No wheezing or rales.  Abdominal:     General: There is no distension.     Palpations: Abdomen is soft.  Musculoskeletal:        General: No tenderness.     Cervical back: Normal range of motion and neck supple.     Right lower leg: No edema.  Left lower leg: No edema.  Skin:    General: Skin is warm and dry.  Neurological:     General: No focal deficit present.     Mental Status: She is alert and oriented to person, place, and time.  Psychiatric:        Mood and Affect: Mood normal.        Behavior: Behavior normal.        Thought Content: Thought content normal.   Assessment & Plan:  1: Chronic heart failure with preserved ejection fraction without structural changes- - NYHA class II - euvolemic today - scales given today and she was instructed to weigh daily and call for an overnight weight gain of  > 2 pounds or a weekly weight gain of > 5 pounds - weight 82.4 pounds from last visit here 6 weeks ago - drinking 2-3 cans of ensure daily - no longer adding salt to her food since her recent admission; reviewed the importance of looking at food labels  - BNP 07/03/21 was 12.8  2: SVT- - saw cardiology (Fath) during recent admission; currently does not have f/u scheduled; contact information provided on her AVS so that her daughter (who arranges the appointments) can call and schedule the appointment - BMP 07/06/21 reviewed and showed sodium 128, potassium 3.9, creatinine 0.46 and GFR >60  3: Anemia- - sees GI (Vigg) 07/21/21 - hemoglobin 07/05/21 was 9.9  4: Hyponatremia- - check BMP today  5: Tobacco use- - smoking < 1 ppd of cigarettes; lives with her daughter and has to now go outside the home to smoke - complete cessation discussed for 3 minutes with her   Medication bottles reviewed.

## 2021-08-10 ENCOUNTER — Ambulatory Visit: Payer: Medicare Other | Admitting: Family

## 2021-08-19 ENCOUNTER — Encounter: Payer: Self-pay | Admitting: Family

## 2021-08-19 ENCOUNTER — Ambulatory Visit: Payer: Medicare Other | Attending: Family | Admitting: Family

## 2021-08-19 ENCOUNTER — Other Ambulatory Visit: Payer: Self-pay

## 2021-08-19 VITALS — BP 158/81 | HR 88 | Resp 16 | Ht 61.0 in | Wt 82.0 lb

## 2021-08-19 DIAGNOSIS — I471 Supraventricular tachycardia: Secondary | ICD-10-CM | POA: Diagnosis not present

## 2021-08-19 DIAGNOSIS — D638 Anemia in other chronic diseases classified elsewhere: Secondary | ICD-10-CM

## 2021-08-19 DIAGNOSIS — E871 Hypo-osmolality and hyponatremia: Secondary | ICD-10-CM | POA: Insufficient documentation

## 2021-08-19 DIAGNOSIS — Z720 Tobacco use: Secondary | ICD-10-CM

## 2021-08-19 DIAGNOSIS — F17210 Nicotine dependence, cigarettes, uncomplicated: Secondary | ICD-10-CM | POA: Insufficient documentation

## 2021-08-19 DIAGNOSIS — R0602 Shortness of breath: Secondary | ICD-10-CM | POA: Diagnosis not present

## 2021-08-19 DIAGNOSIS — D649 Anemia, unspecified: Secondary | ICD-10-CM | POA: Diagnosis not present

## 2021-08-19 DIAGNOSIS — I5032 Chronic diastolic (congestive) heart failure: Secondary | ICD-10-CM | POA: Diagnosis present

## 2021-08-19 DIAGNOSIS — M25569 Pain in unspecified knee: Secondary | ICD-10-CM | POA: Insufficient documentation

## 2021-08-19 LAB — BASIC METABOLIC PANEL
Anion gap: 5 (ref 5–15)
BUN: 13 mg/dL (ref 8–23)
CO2: 25 mmol/L (ref 22–32)
Calcium: 9.1 mg/dL (ref 8.9–10.3)
Chloride: 102 mmol/L (ref 98–111)
Creatinine, Ser: 0.39 mg/dL — ABNORMAL LOW (ref 0.44–1.00)
GFR, Estimated: >60 mL/min (ref >60)
Glucose, Bld: 94 mg/dL (ref 70–99)
Potassium: 3.8 mmol/L (ref 3.5–5.1)
Sodium: 132 mmol/L — ABNORMAL LOW (ref 135–145)

## 2021-08-19 NOTE — Patient Instructions (Signed)
Continue weighing daily and call for an overnight weight gain of > 2 pounds or a weekly weight gain of >5 pounds. 

## 2021-08-19 NOTE — Progress Notes (Signed)
Patient ID: Peggy Brown, female    DOB: 12-03-1955, 65 y.o.   MRN: [id number] HPI  Peggy Brown is a 65 y/o female with a history of anemia, SVT, current tobacco use and chronic heart failure.   Echo report from 04/28/21 reviewed and showed an EF of 55% along with mild MR without LVH.   Admitted 07/03/21 due to CP, SOB and fatigue. Sodium was 117. IVF started. Sodium trended up with improvement of mental status. Acute HF ruled out with BNP of 12. Discharged after 3 days. Admitted 05/11/21 due to shortness of breath, weakness and fatigue. Was in SVT and given adenosine as well as IV cardizem. Cardiology consult obtained. Weaned off oxygen. Initially given IV lasix with transition to oral diuretics. Hypokalemia replaced.  Discharged after 2 days.   She presents today for a follow-up visit with a chief complaint of minimal fatigue with moderate exertion. She describes this as chronic in nature having been present for several years. She has shortness of breath and knee pain along with this. She denies any dizziness, difficulty sleeping, abdominal distention, palpitations, pedal edema, chest pain or weight gain.   Drinks 6 cans of ensure daily.   Past Medical History:  Diagnosis Date   Anemia    CHF (congestive heart failure) (HCC)    SVT (supraventricular tachycardia) (HCC)    Past Surgical History:  Procedure Laterality Date   CESAREAN SECTION     COLONOSCOPY N/A 04/27/2021   Procedure: COLONOSCOPY;  Surgeon: Peggy Bill, MD;  Location: [facility] ENDOSCOPY;  Service: Endoscopy;  Laterality: N/A;   ESOPHAGOGASTRODUODENOSCOPY N/A 04/27/2021   Procedure: ESOPHAGOGASTRODUODENOSCOPY (EGD);  Surgeon: Peggy Bill, MD;  Location: [facility] ENDOSCOPY;  Service: Endoscopy;  Laterality: N/A;   Family History  Problem Relation Age of Onset   Hypertension Mother    Thyroid cancer Mother    COPD Father    Social History   Tobacco Use   Smoking status: Every Day    Packs/day: 0.50    Years:  45.00    Pack years: 22.50    Types: Cigarettes   Smokeless tobacco: Never  Substance Use Topics   Alcohol use: Yes    Alcohol/week: 2.0 standard drinks    Types: 2 Cans of beer per week   No Known Allergies  Prior to Admission medications   Medication Sig Start Date End Date Taking? Authorizing Provider  acetaminophen (TYLENOL) 500 MG tablet Take 1,000 mg by mouth every 8 (eight) hours as needed for moderate pain or mild pain.   Yes [provider]  feeding supplement (ENSURE ENLIVE / ENSURE PLUS) LIQD Take 237 mLs by mouth 2 (two) times daily between meals. 04/30/21  Yes Peggy Coy, MD  metoprolol succinate (TOPROL-XL) 25 MG 24 hr tablet Take 1 tablet (25 mg total) by mouth daily. 05/14/21  Yes Dahal, Melina Schools, MD  Multiple Vitamin (MULTIVITAMIN WITH MINERALS) TABS tablet Take 1 tablet by mouth daily. 05/13/21  Yes Dahal, Melina Schools, MD  pantoprazole (PROTONIX) 40 MG tablet Take 1 tablet (40 mg total) by mouth 2 (two) times daily. 04/30/21  Yes Peggy Coy, MD  potassium chloride SA (KLOR-CON) 10 MEQ tablet Take 1 tablet (10 mEq total) by mouth daily. 07/06/21  Yes Danford, Earl Lites, MD    Review of Systems  Constitutional:  Positive for fatigue. Negative for appetite change.  HENT:  Negative for congestion, postnasal drip and sore throat.   Eyes: Negative.   Respiratory:  Positive for shortness of breath (with moderate exertion).  Negative for chest tightness.   Cardiovascular:  Negative for chest pain, palpitations and leg swelling.  Gastrointestinal:  Negative for abdominal distention and abdominal pain.  Endocrine: Negative.   Genitourinary: Negative.   Musculoskeletal:  Positive for arthralgias (knee pain). Negative for back pain.  Skin: Negative.   Allergic/Immunologic: Negative.   Neurological:  Negative for dizziness and light-headedness.  Hematological:  Negative for adenopathy. Does not bruise/bleed easily.  Psychiatric/Behavioral:  Negative for dysphoric mood and  sleep disturbance (sleeping on 1 pillow). The patient is not nervous/anxious.    Vitals:   08/19/21 1509  BP: (!) 158/81  Pulse: 88  Resp: 16  SpO2: 99%  Weight: 82 lb (37.2 kg)  Height: 5\' 1"  (1.549 m)   Wt Readings from Last 3 Encounters:  08/19/21 82 lb (37.2 kg)  07/04/21 84 lb 7 oz (38.3 kg)  06/25/21 82 lb 4 oz (37.3 kg)   Lab Results  Component Value Date   CREATININE 0.39 (L) 08/19/2021   CREATININE 0.46 07/06/2021   CREATININE 0.43 (L) 07/05/2021    Physical Exam Vitals and nursing note reviewed.  Constitutional:      Appearance: Normal appearance.  HENT:     Head: Normocephalic and atraumatic.  Cardiovascular:     Rate and Rhythm: Normal rate and regular rhythm.  Pulmonary:     Effort: Pulmonary effort is normal. No respiratory distress.     Breath sounds: No wheezing or rales.  Abdominal:     General: There is no distension.     Palpations: Abdomen is soft.  Musculoskeletal:        General: No tenderness.     Cervical back: Normal range of motion and neck supple.     Right lower leg: No edema.     Left lower leg: No edema.  Skin:    General: Skin is warm and dry.  Neurological:     General: No focal deficit present.     Mental Status: She is alert and oriented to person, place, and time.  Psychiatric:        Mood and Affect: Mood normal.        Behavior: Behavior normal.        Thought Content: Thought content normal.   Assessment & Plan:  1: Chronic heart failure with preserved ejection fraction without structural changes- - NYHA class II - euvolemic today - weighing daily; reminded to weigh daily and call for an overnight weight gain of > 2 pounds or a weekly weight gain of > 5 pounds - weight unchanged from last visit here 6 weeks ago - drinking 6 cans of ensure daily - not adding any salt to her food - BNP 07/03/21 was 12.8  2: SVT- - is not sure when she sees cardiology 09/02/21) - BMP 07/06/21 reviewed and showed sodium 128, potassium 3.9,  creatinine 0.46 and GFR >60  3: Anemia- - sees GI (Peggy Brown) 07/21/21 - hemoglobin 07/05/21 was 9.9  4: Hyponatremia- - check BMP today  5: Tobacco use- - smoking < 1 ppd of cigarettes; lives with her daughter and has to now go outside the home to smoke - complete cessation discussed for 3 minutes with her   Patient did not bring her medications nor a list. Each medication was verbally reviewed with the patient and she was encouraged to bring the bottles to every visit to confirm accuracy of list.   Return in 3 months or sooner for any questions/problems before then.

## 2021-08-20 ENCOUNTER — Encounter: Payer: Self-pay | Admitting: Family

## 2021-11-20 NOTE — Progress Notes (Unsigned)
Patient ID: Peggy Brown, female    DOB: 11-Nov-1955, 66 y.o.   MRN: [id number] HPI  Peggy Brown is a 66 y/o female with a history of anemia, SVT, current tobacco use and chronic heart failure.   Echo report from 04/28/21 reviewed and showed an EF of 55% along with mild MR without LVH.   Admitted 07/03/21 due to CP, SOB and fatigue. Sodium was 117. IVF started. Sodium trended up with improvement of mental status. Acute HF ruled out with BNP of 12. Discharged after 3 days.   She presents today for a follow-up visit with a chief complaint of   Drinks 6 cans of ensure daily.   Past Medical History:  Diagnosis Date   Anemia    CHF (congestive heart failure) (HCC)    SVT (supraventricular tachycardia) ([place])    Past Surgical History:  Procedure Laterality Date   CESAREAN SECTION     COLONOSCOPY N/A 04/27/2021   Procedure: COLONOSCOPY;  Surgeon: Lesly Rubenstein, MD;  Location: [facility] ENDOSCOPY;  Service: Endoscopy;  Laterality: N/A;   ESOPHAGOGASTRODUODENOSCOPY N/A 04/27/2021   Procedure: ESOPHAGOGASTRODUODENOSCOPY (EGD);  Surgeon: Lesly Rubenstein, MD;  Location: [facility] ENDOSCOPY;  Service: Endoscopy;  Laterality: N/A;   Family History  Problem Relation Age of Onset   Hypertension Mother    Thyroid cancer Mother    COPD Father    Social History   Tobacco Use   Smoking status: Every Day    Packs/day: 0.50    Years: 45.00    Pack years: 22.50    Types: Cigarettes   Smokeless tobacco: Never  Substance Use Topics   Alcohol use: Yes    Alcohol/week: 2.0 standard drinks    Types: 2 Cans of beer per week   No Known Allergies    Review of Systems  Constitutional:  Positive for fatigue. Negative for appetite change.  HENT:  Negative for congestion, postnasal drip and sore throat.   Eyes: Negative.   Respiratory:  Positive for shortness of breath (with moderate exertion). Negative for chest tightness.   Cardiovascular:  Negative for chest pain, palpitations and leg  swelling.  Gastrointestinal:  Negative for abdominal distention and abdominal pain.  Endocrine: Negative.   Genitourinary: Negative.   Musculoskeletal:  Positive for arthralgias (knee pain). Negative for back pain.  Skin: Negative.   Allergic/Immunologic: Negative.   Neurological:  Negative for dizziness and light-headedness.  Hematological:  Negative for adenopathy. Does not bruise/bleed easily.  Psychiatric/Behavioral:  Negative for dysphoric mood and sleep disturbance (sleeping on 1 pillow). The patient is not nervous/anxious.       Physical Exam Vitals and nursing note reviewed.  Constitutional:      Appearance: Normal appearance.  HENT:     Head: Normocephalic and atraumatic.  Cardiovascular:     Rate and Rhythm: Normal rate and regular rhythm.  Pulmonary:     Effort: Pulmonary effort is normal. No respiratory distress.     Breath sounds: No wheezing or rales.  Abdominal:     General: There is no distension.     Palpations: Abdomen is soft.  Musculoskeletal:        General: No tenderness.     Cervical back: Normal range of motion and neck supple.     Right lower leg: No edema.     Left lower leg: No edema.  Skin:    General: Skin is warm and dry.  Neurological:     General: No focal deficit present.     Mental  Status: She is alert and oriented to person, place, and time.  Psychiatric:        Mood and Affect: Mood normal.        Behavior: Behavior normal.        Thought Content: Thought content normal.   Assessment & Plan:  1: Chronic heart failure with preserved ejection fraction without structural changes- - NYHA class II - euvolemic today - weighing daily; reminded to weigh daily and call for an overnight weight gain of > 2 pounds or a weekly weight gain of > 5 pounds - weight 82 pounds from last visit here 3 months ago - drinking 6 cans of ensure daily - not adding any salt to her food - BNP 07/03/21 was 12.8  2: SVT- - is not sure when she sees  cardiology Peggy Brown) - BMP 08/19/21 reviewed and showed sodium 132, potassium 3.8, creatinine 0.39 and GFR >60  3: Anemia- - sees GI (Peggy Brown) 07/21/21 - hemoglobin 07/05/21 was 9.9  4: Tobacco use- - smoking < 1 ppd of cigarettes; lives with her daughter and has to now go outside the home to smoke - complete cessation discussed for 3 minutes with her   Patient did not bring her medications nor a list. Each medication was verbally reviewed with the patient and she was encouraged to bring the bottles to every visit to confirm accuracy of list.

## 2021-11-22 ENCOUNTER — Telehealth: Payer: Self-pay | Admitting: Family

## 2021-11-22 ENCOUNTER — Ambulatory Visit: Payer: Medicare Other | Admitting: Family

## 2021-11-22 NOTE — Telephone Encounter (Signed)
Patient did not show for her [facility] appointment on 11/22/21. Will attempt to reschedule.
